# Patient Record
Sex: Female | Born: 1935 | Race: Black or African American | Hispanic: No | Smoking: Never smoker
Health system: Southern US, Community
[De-identification: ages and names within clinical notes are randomized; demographics above are authoritative.]

## PROBLEM LIST (undated history)

## (undated) DIAGNOSIS — Z923 Personal history of irradiation: Secondary | ICD-10-CM

## (undated) DIAGNOSIS — K649 Unspecified hemorrhoids: Secondary | ICD-10-CM

## (undated) DIAGNOSIS — C801 Malignant (primary) neoplasm, unspecified: Secondary | ICD-10-CM

## (undated) DIAGNOSIS — C7931 Secondary malignant neoplasm of brain: Secondary | ICD-10-CM

## (undated) DIAGNOSIS — F419 Anxiety disorder, unspecified: Secondary | ICD-10-CM

## (undated) DIAGNOSIS — K219 Gastro-esophageal reflux disease without esophagitis: Secondary | ICD-10-CM

## (undated) DIAGNOSIS — Z5189 Encounter for other specified aftercare: Secondary | ICD-10-CM

## (undated) DIAGNOSIS — I10 Essential (primary) hypertension: Secondary | ICD-10-CM

## (undated) DIAGNOSIS — E785 Hyperlipidemia, unspecified: Secondary | ICD-10-CM

## (undated) HISTORY — DX: Encounter for other specified aftercare: Z51.89

## (undated) HISTORY — DX: Personal history of irradiation: Z92.3

## (undated) HISTORY — DX: Hyperlipidemia, unspecified: E78.5

## (undated) HISTORY — DX: Unspecified hemorrhoids: K64.9

## (undated) HISTORY — DX: Gastro-esophageal reflux disease without esophagitis: K21.9

---

## 1966-10-05 HISTORY — PX: ABDOMINAL HYSTERECTOMY: SHX81

## 2005-02-24 ENCOUNTER — Emergency Department (HOSPITAL_COMMUNITY): Admission: EM | Admit: 2005-02-24 | Discharge: 2005-02-24 | Payer: Self-pay | Admitting: Family Medicine

## 2006-08-03 ENCOUNTER — Emergency Department (HOSPITAL_COMMUNITY): Admission: EM | Admit: 2006-08-03 | Discharge: 2006-08-03 | Payer: Self-pay | Admitting: Family Medicine

## 2007-02-23 ENCOUNTER — Encounter: Admission: RE | Admit: 2007-02-23 | Discharge: 2007-02-23 | Payer: Self-pay | Admitting: Orthopedic Surgery

## 2007-09-09 ENCOUNTER — Emergency Department (HOSPITAL_COMMUNITY): Admission: EM | Admit: 2007-09-09 | Discharge: 2007-09-09 | Payer: Self-pay | Admitting: Family Medicine

## 2007-12-09 ENCOUNTER — Emergency Department (HOSPITAL_COMMUNITY): Admission: EM | Admit: 2007-12-09 | Discharge: 2007-12-09 | Payer: Self-pay | Admitting: Emergency Medicine

## 2007-12-22 ENCOUNTER — Emergency Department (HOSPITAL_COMMUNITY): Admission: EM | Admit: 2007-12-22 | Discharge: 2007-12-22 | Payer: Self-pay | Admitting: Emergency Medicine

## 2008-01-12 ENCOUNTER — Encounter: Admission: RE | Admit: 2008-01-12 | Discharge: 2008-01-12 | Payer: Self-pay | Admitting: Orthopedic Surgery

## 2008-01-23 ENCOUNTER — Emergency Department (HOSPITAL_COMMUNITY): Admission: EM | Admit: 2008-01-23 | Discharge: 2008-01-23 | Payer: Self-pay | Admitting: Emergency Medicine

## 2008-02-02 ENCOUNTER — Encounter: Admission: RE | Admit: 2008-02-02 | Discharge: 2008-02-20 | Payer: Self-pay | Admitting: Orthopedic Surgery

## 2008-02-21 ENCOUNTER — Encounter: Admission: RE | Admit: 2008-02-21 | Discharge: 2008-02-21 | Payer: Self-pay | Admitting: Orthopedic Surgery

## 2010-02-04 ENCOUNTER — Emergency Department (HOSPITAL_COMMUNITY): Admission: EM | Admit: 2010-02-04 | Discharge: 2010-02-04 | Payer: Self-pay | Admitting: Emergency Medicine

## 2010-06-02 ENCOUNTER — Encounter: Admission: RE | Admit: 2010-06-02 | Discharge: 2010-06-02 | Payer: Self-pay | Admitting: Orthopedic Surgery

## 2010-06-26 ENCOUNTER — Encounter: Admission: RE | Admit: 2010-06-26 | Discharge: 2010-06-26 | Payer: Self-pay | Admitting: Orthopedic Surgery

## 2010-07-06 ENCOUNTER — Emergency Department (HOSPITAL_COMMUNITY): Admission: EM | Admit: 2010-07-06 | Discharge: 2010-07-06 | Payer: Self-pay | Admitting: Emergency Medicine

## 2010-08-28 ENCOUNTER — Ambulatory Visit: Payer: Self-pay | Admitting: Family Medicine

## 2010-08-28 ENCOUNTER — Encounter: Payer: Self-pay | Admitting: Gastroenterology

## 2010-08-28 ENCOUNTER — Inpatient Hospital Stay (HOSPITAL_COMMUNITY): Admission: EM | Admit: 2010-08-28 | Discharge: 2010-08-31 | Payer: Self-pay | Admitting: Emergency Medicine

## 2010-08-29 ENCOUNTER — Ambulatory Visit: Payer: Self-pay | Admitting: Gastroenterology

## 2010-09-03 ENCOUNTER — Encounter: Payer: Self-pay | Admitting: Gastroenterology

## 2010-09-03 DIAGNOSIS — R933 Abnormal findings on diagnostic imaging of other parts of digestive tract: Secondary | ICD-10-CM | POA: Insufficient documentation

## 2010-10-24 ENCOUNTER — Emergency Department (HOSPITAL_COMMUNITY)
Admission: EM | Admit: 2010-10-24 | Discharge: 2010-10-24 | Payer: Self-pay | Source: Home / Self Care | Admitting: Family Medicine

## 2010-10-26 ENCOUNTER — Encounter: Payer: Self-pay | Admitting: Internal Medicine

## 2010-11-06 NOTE — Procedures (Signed)
Summary: Upper Endoscopy  Patient: [name] Note: All result statuses are Final unless otherwise noted.  Tests: (1) Upper Endoscopy (EGD)   EGD Upper Endoscopy       DONE     [facility]     [address]     [place], [place]  [address]           ENDOSCOPY PROCEDURE REPORT           PATIENT:  [name], Katie  MR#:  [id number]     BIRTHDATE:  Feb 03, 1934, 76 yrs. old  GENDER:  female     ENDOSCOPIST:  Rachael Fee, MD     PROCEDURE DATE:  08/29/2010     PROCEDURE:  EGD with biopsy, 43239     ASA CLASS:  Class II     INDICATIONS:  melena, anemia     MEDICATIONS:  Fentanyl 62.5 mcg IV, Versed 6 mg IV     TOPICAL ANESTHETIC:  Cetacaine Spray     DESCRIPTION OF PROCEDURE:   After the risks benefits and     alternatives of the procedure were thoroughly explained, informed     consent was obtained.  The EG-[id number] ([id number]) endoscope was     introduced through the mouth and advanced to the second portion of     the duodenum, without limitations.  The instrument was slowly     withdrawn as the mucosa was fully examined.           <<PROCEDUREIMAGES>>     There was a 3-4cm, round, adenomatous appearing mass located in     proximal stomach, 4cm distal to the GE junction. The mass was     friable and there was one umbilication/ulceration in the mass that     was 3-88mm across. The mass was not actively bleeding. Biopsies     were taken and sent to pathology (jar 1) (see image005, image006,     and image007).  Otherwise the examination was normal (see     image004, image003, image002, and image001).    Retroflexed views     revealed mass.    The scope was then withdrawn from the patient     and the procedure completed.     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  The mass was biopsied.     2) Otherwise normal examination           RECOMMENDATIONS:     Will order staging CT (chest, abd, pelvis), CEA, INR.  I have     contacted general surgery to consult.     Adenocarcinoma vs GIST vs carcinoid.  Given the fact that it is     bleeding, will require resection however if metastatic disease is     found by staging CT then could consider neo-adjuvant chemo/XRT.           ______________________________     Rachael Fee, MD           n.     eSIGNED:   Rachael Fee at 08/29/2010 09:55 AM           Katie [name], [id number]  Note: An exclamation mark (!) indicates a result that was not dispersed into the flowsheet. Document Creation Date: 08/29/2010 9:56 AM _______________________________________________________________________  (1) Order  result status: Final Collection or observation date-time: 08/29/2010 09:42 Requested date-time:  Receipt date-time:  Reported date-time:  Referring Physician:   Ordering Physician: Rob Bunting [phone]) Specimen Source:  Source: Launa Grill Order Number: [id number] Lab site:

## 2010-11-06 NOTE — Letter (Signed)
Summary: Results Letter  [facility]  [address] [place], [place] [address]   Phone: [phone]  Fax: [phone]        September 03, 2010 MRN: [id number]    Katie [name] [address] [place], [place]  [address]    Dear [name],   As we discussed on the phone today, the biopsies of the tumor in your stomach were non-diagnostic.  That tumor certainly is the cause of your bleeding and is likely to bleed again and so I recommend that you visit with the surgeon to consider resection, surgery.  Given the endoscopic and CT scan images of the tumor I doubt that is a gastric adenocarcinoma.  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 I hope you reconsider sitting down with the surgeon to talk about this. My office is going to work with the office of Dr. Lindie Spruce to discuss this further.    Sincerely,  Rachael Fee MD  This letter has been electronically signed by your physician.  Appended Document: Results Letter letter mailed

## 2010-11-06 NOTE — Consult Note (Signed)
Summary: [facility]   Imported By: Sherian Rein 09/04/2010 08:51:22  _____________________________________________________________________  External Attachment:    Type:   Image     Comment:   External Document

## 2010-12-16 LAB — CBC
HCT: 30.9 % — ABNORMAL LOW (ref 36.0–46.0)
Hemoglobin: 10.8 g/dL — ABNORMAL LOW (ref 12.0–15.0)
Hemoglobin: 8 g/dL — ABNORMAL LOW (ref 12.0–15.0)
MCH: 31.6 pg (ref 26.0–34.0)
MCHC: 34.3 g/dL (ref 30.0–36.0)
MCV: 92.1 fL (ref 78.0–100.0)
MCV: 92.5 fL (ref 78.0–100.0)
Platelets: 173 10*3/uL (ref 150–400)
Platelets: 186 10*3/uL (ref 150–400)
Platelets: 198 10*3/uL (ref 150–400)
Platelets: 210 10*3/uL (ref 150–400)
RBC: 2.53 MIL/uL — ABNORMAL LOW (ref 3.87–5.11)
RBC: 3.07 MIL/uL — ABNORMAL LOW (ref 3.87–5.11)
RBC: 3.34 MIL/uL — ABNORMAL LOW (ref 3.87–5.11)
RBC: 3.47 MIL/uL — ABNORMAL LOW (ref 3.87–5.11)
RDW: 13.2 % (ref 11.5–15.5)
WBC: 5.9 10*3/uL (ref 4.0–10.5)
WBC: 6.2 10*3/uL (ref 4.0–10.5)
WBC: 7.9 10*3/uL (ref 4.0–10.5)

## 2010-12-16 LAB — DIFFERENTIAL
Basophils Relative: 0 % (ref 0–1)
Eosinophils Absolute: 0.1 10*3/uL (ref 0.0–0.7)
Monocytes Absolute: 0.4 10*3/uL (ref 0.1–1.0)
Monocytes Relative: 5 % (ref 3–12)
Neutro Abs: 5.8 10*3/uL (ref 1.7–7.7)

## 2010-12-16 LAB — BASIC METABOLIC PANEL
CO2: 27 mEq/L (ref 19–32)
Chloride: 105 mEq/L (ref 96–112)
Chloride: 110 mEq/L (ref 96–112)
Creatinine, Ser: 0.69 mg/dL (ref 0.4–1.2)
GFR calc Af Amer: >60 mL/min (ref >60)
GFR calc Af Amer: >60 mL/min (ref >60)
Glucose, Bld: 140 mg/dL — ABNORMAL HIGH (ref 70–99)
Potassium: 3.5 mEq/L (ref 3.5–5.1)
Sodium: 143 mEq/L (ref 135–145)

## 2010-12-16 LAB — COMPREHENSIVE METABOLIC PANEL
ALT: 17 U/L (ref 0–35)
Albumin: 3.1 g/dL — ABNORMAL LOW (ref 3.5–5.2)
Alkaline Phosphatase: 38 U/L — ABNORMAL LOW (ref 39–117)
Potassium: 3.2 mEq/L — ABNORMAL LOW (ref 3.5–5.1)
Sodium: 136 mEq/L (ref 135–145)
Total Protein: 5.2 g/dL — ABNORMAL LOW (ref 6.0–8.3)

## 2010-12-16 LAB — URINALYSIS, ROUTINE W REFLEX MICROSCOPIC
Bilirubin Urine: NEGATIVE
Glucose, UA: NEGATIVE mg/dL
Ketones, ur: NEGATIVE mg/dL
Nitrite: NEGATIVE
pH: 5.5 (ref 5.0–8.0)

## 2010-12-16 LAB — CROSSMATCH: Unit division: 0

## 2010-12-16 LAB — HEMOCCULT GUIAC POC 1CARD (OFFICE): Fecal Occult Bld: POSITIVE

## 2010-12-16 LAB — CEA: CEA: <0.5 ng/mL (ref 0.0–5.0)

## 2010-12-16 LAB — ABO/RH: ABO/RH(D): A POS

## 2010-12-16 LAB — URINE CULTURE
Colony Count: NO GROWTH
Culture  Setup Time: 201111241805

## 2010-12-16 LAB — HEMOGLOBIN AND HEMATOCRIT, BLOOD: HCT: 25.1 % — ABNORMAL LOW (ref 36.0–46.0)

## 2010-12-16 LAB — PREPARE RBC (CROSSMATCH)

## 2010-12-16 LAB — PROTIME-INR: Prothrombin Time: 12.8 seconds (ref 11.6–15.2)

## 2010-12-18 LAB — URINALYSIS, ROUTINE W REFLEX MICROSCOPIC
Bilirubin Urine: NEGATIVE
Hgb urine dipstick: NEGATIVE
Nitrite: NEGATIVE
Specific Gravity, Urine: 1.021 (ref 1.005–1.030)
pH: 7 (ref 5.0–8.0)

## 2010-12-18 LAB — URINE MICROSCOPIC-ADD ON

## 2010-12-23 LAB — POCT URINALYSIS DIP (DEVICE)
Glucose, UA: NEGATIVE mg/dL
Nitrite: NEGATIVE
Specific Gravity, Urine: 1.025 (ref 1.005–1.030)
Urobilinogen, UA: 1 mg/dL (ref 0.0–1.0)

## 2010-12-23 LAB — URINE CULTURE

## 2011-06-29 LAB — COMPREHENSIVE METABOLIC PANEL
Albumin: 3.7
BUN: 17
Creatinine, Ser: 0.77
Total Protein: 6.5

## 2011-06-29 LAB — POCT URINALYSIS DIP (DEVICE)
Hgb urine dipstick: NEGATIVE
Protein, ur: NEGATIVE
Specific Gravity, Urine: 1.02
pH: 5.5

## 2011-06-29 LAB — DIFFERENTIAL
Basophils Absolute: 0
Lymphocytes Relative: 15
Monocytes Absolute: 0.5
Monocytes Relative: 5
Neutro Abs: 8.1 — ABNORMAL HIGH
Neutrophils Relative %: 79 — ABNORMAL HIGH

## 2011-06-29 LAB — CBC
HCT: 38.9
MCHC: 34.5
MCV: 90.6
Platelets: 200
RDW: 13.3

## 2012-01-02 ENCOUNTER — Emergency Department (INDEPENDENT_AMBULATORY_CARE_PROVIDER_SITE_OTHER)
Admission: EM | Admit: 2012-01-02 | Discharge: 2012-01-02 | Disposition: A | Discharge status: Home / Self Care | Payer: Medicare Other | Source: Home / Self Care | Attending: Emergency Medicine | Admitting: Emergency Medicine

## 2012-01-02 ENCOUNTER — Encounter (HOSPITAL_COMMUNITY): Payer: Self-pay | Admitting: *Deleted

## 2012-01-02 DIAGNOSIS — L509 Urticaria, unspecified: Secondary | ICD-10-CM

## 2012-01-02 HISTORY — DX: Anxiety disorder, unspecified: F41.9

## 2012-01-02 HISTORY — DX: Essential (primary) hypertension: I10

## 2012-01-02 MED ORDER — PREDNISONE 20 MG PO TABS: 40.0000 mg | ORAL_TABLET | Freq: Every day | ORAL | Status: AC

## 2012-01-02 MED ORDER — HYDROXYZINE HCL 25 MG PO TABS: 25.0000 mg | ORAL_TABLET | Freq: Four times a day (QID) | ORAL | Status: AC

## 2012-01-02 NOTE — ED Provider Notes (Signed)
History     CSN: [id number]  Arrival date & time 01/02/12  4098   First MD Initiated Contact with Patient 01/02/12 1013      Chief Complaint  Patient presents with  . Rash    (Consider location/radiation/quality/duration/timing/severity/associated sxs/prior treatment) HPI Comments: MH that much but they come and go have some new ones, left forearm today that didn't have yesterday some on my torso some on the legs, arms. No one else at home or in [place] stain for about a week had a similar rash. Suspicious of a lotion and been using her my skin lately he'll started after he started using this new lotion. No discomfort swallowing, no swelling, shortness of breath or troubles breathing  Patient is a 78 y.o. female presenting with rash. The history is provided by the patient.  Rash  This is a new problem. The current episode started more than 2 days ago. The problem has not changed since onset.The problem is associated with nothing. There has been no fever. The patient is experiencing no pain. The pain has been intermittent since onset. Associated symptoms include itching. Pertinent negatives include no blisters and no weeping.    Past Medical History  Diagnosis Date  . Glaucoma   . Hypertension   . Anxiety     Past Surgical History  Procedure Date  . Abdominal hysterectomy     History reviewed. No pertinent family history.  History  Substance Use Topics  . Smoking status: Never Smoker   . Smokeless tobacco: Not on file  . Alcohol Use: No    OB History    Grav Para Term Preterm Abortions TAB SAB Ect Mult Living                  Review of Systems  Constitutional: Negative for fever, chills, appetite change and fatigue.  HENT: Negative for congestion, facial swelling, rhinorrhea, neck pain and ear discharge.   Respiratory: Negative for cough, shortness of breath and wheezing.   Cardiovascular: Negative for leg swelling.  Skin: Positive for itching and rash.     Allergies  Review of patient's allergies indicates no known allergies.  Home Medications   Current Outpatient Rx  Name Route Sig Dispense Refill  . ALPRAZOLAM 0.25 MG PO TABS Oral Take 0.25 mg by mouth at bedtime as needed.    Marland Kitchen BENAZEPRIL-HYDROCHLOROTHIAZIDE 20-25 MG PO TABS Oral Take 1 tablet by mouth daily.    Marland Kitchen LATANOPROST 0.005 % OP SOLN  1 drop at bedtime.    Marland Kitchen VERAPAMIL HCL 120 MG PO TABS Oral Take 120 mg by mouth 2 (two) times daily.    Marland Kitchen HYDROXYZINE HCL 25 MG PO TABS Oral Take 1 tablet (25 mg total) by mouth every 6 (six) hours. 12 tablet 0  . PREDNISONE 20 MG PO TABS Oral Take 2 tablets (40 mg total) by mouth daily. 2 tablets daily for 5 days 10 tablet 0    BP 130/80  Pulse 75  Temp(Src) 98 F (36.7 C) (Oral)  Resp 16  SpO2 98%  Physical Exam  Constitutional: She appears well-developed and well-nourished.  Non-toxic appearance. She does not have a sickly appearance. She does not appear ill. No distress.  HENT:  Head: Normocephalic.  Mouth/Throat: Uvula is midline and oropharynx is clear and moist. No oropharyngeal exudate, posterior oropharyngeal edema or posterior oropharyngeal erythema.  Eyes: Conjunctivae are normal.  Neck: Neck supple.  Pulmonary/Chest: Effort normal and breath sounds normal. No respiratory distress.  Abdominal: There  is no tenderness. There is no rebound.  Musculoskeletal: She exhibits no tenderness.  Neurological: She is alert.  Skin: Skin is warm. Rash noted. She is not diaphoretic. There is erythema.    ED Course  Procedures (including critical care time)  Labs Reviewed - No data to display No results found.   1. Urticaria       MDM  Global generalized papular abruption erythema character that clears easily with digital pressure no mucosal movement and no respiratory symptoms. Symptomatic treatment for hives urticaria explained        Jimmie Molly, MD 01/02/12 1147

## 2012-01-02 NOTE — Discharge Instructions (Signed)
Has discuss use this to medicines prednisone for 5 days and the hydroxyzine as needed every 6-8 hours (only if needed)

## 2012-01-02 NOTE — ED Notes (Addendum)
Pt with onset of rash Thursday - will improve then reappear - denies itching - rash on arms and one spot on right side abd - pt using new hand body lotion has been using on arms x one month denies new medications verapamil blue tab instead of white x 2 weeks

## 2012-06-13 ENCOUNTER — Ambulatory Visit (HOSPITAL_COMMUNITY): Payer: Medicare Other

## 2012-06-14 ENCOUNTER — Other Ambulatory Visit (HOSPITAL_COMMUNITY): Payer: Medicare Other

## 2012-06-16 ENCOUNTER — Other Ambulatory Visit (HOSPITAL_COMMUNITY): Payer: Self-pay | Admitting: Internal Medicine

## 2012-06-16 ENCOUNTER — Ambulatory Visit (HOSPITAL_COMMUNITY): Payer: Medicare Other | Attending: Internal Medicine | Admitting: Radiology

## 2012-06-16 DIAGNOSIS — I369 Nonrheumatic tricuspid valve disorder, unspecified: Secondary | ICD-10-CM | POA: Insufficient documentation

## 2012-06-16 DIAGNOSIS — I10 Essential (primary) hypertension: Secondary | ICD-10-CM | POA: Insufficient documentation

## 2012-06-16 DIAGNOSIS — I059 Rheumatic mitral valve disease, unspecified: Secondary | ICD-10-CM | POA: Insufficient documentation

## 2012-06-16 DIAGNOSIS — R0602 Shortness of breath: Secondary | ICD-10-CM

## 2012-06-16 DIAGNOSIS — R0989 Other specified symptoms and signs involving the circulatory and respiratory systems: Secondary | ICD-10-CM | POA: Insufficient documentation

## 2012-06-16 DIAGNOSIS — R0609 Other forms of dyspnea: Secondary | ICD-10-CM | POA: Insufficient documentation

## 2012-06-16 NOTE — Progress Notes (Signed)
Echocardiogram performed.  

## 2012-06-17 ENCOUNTER — Encounter (HOSPITAL_COMMUNITY): Payer: Self-pay | Admitting: Internal Medicine

## 2012-11-29 ENCOUNTER — Encounter: Payer: Self-pay | Admitting: Gastroenterology

## 2012-12-09 ENCOUNTER — Ambulatory Visit: Payer: Medicare Other | Admitting: Gastroenterology

## 2012-12-14 ENCOUNTER — Ambulatory Visit: Payer: Medicare Other | Admitting: Gastroenterology

## 2012-12-16 ENCOUNTER — Encounter: Payer: Self-pay | Admitting: Gastroenterology

## 2013-01-03 ENCOUNTER — Ambulatory Visit: Payer: Medicare Other | Admitting: Gastroenterology

## 2013-01-08 ENCOUNTER — Encounter (HOSPITAL_BASED_OUTPATIENT_CLINIC_OR_DEPARTMENT_OTHER): Payer: Self-pay

## 2013-01-08 ENCOUNTER — Emergency Department (HOSPITAL_BASED_OUTPATIENT_CLINIC_OR_DEPARTMENT_OTHER)
Admission: EM | Admit: 2013-01-08 | Discharge: 2013-01-08 | Disposition: A | Discharge status: Home / Self Care | Payer: Medicare Other | Source: Home / Self Care | Attending: Emergency Medicine | Admitting: Emergency Medicine

## 2013-01-08 DIAGNOSIS — K625 Hemorrhage of anus and rectum: Secondary | ICD-10-CM

## 2013-01-08 LAB — CBC WITH DIFFERENTIAL/PLATELET
Basophils Absolute: 0 10*3/uL (ref 0.0–0.1)
Eosinophils Relative: 1 % (ref 0–5)
HCT: 34 % — ABNORMAL LOW (ref 36.0–46.0)
Hemoglobin: 11.5 g/dL — ABNORMAL LOW (ref 12.0–15.0)
Lymphocytes Relative: 13 % (ref 12–46)
Lymphs Abs: 1.3 10*3/uL (ref 0.7–4.0)
MCV: 85.4 fL (ref 78.0–100.0)
Monocytes Absolute: 0.5 10*3/uL (ref 0.1–1.0)
Monocytes Relative: 5 % (ref 3–12)
Neutro Abs: 7.7 10*3/uL (ref 1.7–7.7)
RBC: 3.98 MIL/uL (ref 3.87–5.11)
RDW: 14.2 % (ref 11.5–15.5)
WBC: 9.6 10*3/uL (ref 4.0–10.5)

## 2013-01-08 LAB — COMPREHENSIVE METABOLIC PANEL
ALT: 12 U/L (ref 0–35)
AST: 20 U/L (ref 0–37)
CO2: 25 mEq/L (ref 19–32)
Calcium: 9.2 mg/dL (ref 8.4–10.5)
Chloride: 96 mEq/L (ref 96–112)
Creatinine, Ser: 0.6 mg/dL (ref 0.50–1.10)
GFR calc Af Amer: >90 mL/min (ref >90)
GFR calc non Af Amer: 86 mL/min — ABNORMAL LOW (ref >90)
Glucose, Bld: 138 mg/dL — ABNORMAL HIGH (ref 70–99)
Total Bilirubin: 0.4 mg/dL (ref 0.3–1.2)

## 2013-01-08 NOTE — ED Provider Notes (Signed)
History     CSN: [id number]  Arrival date & time 01/08/13  1039   First MD Initiated Contact with Patient 01/08/13 1101      Chief Complaint  Patient presents with  . Rectal Bleeding    (Consider location/radiation/quality/duration/timing/severity/associated sxs/prior treatment) Patient is a 79 y.o. female presenting with hematochezia. The history is provided by the patient.  Rectal Bleeding  The current episode started today. Pertinent negatives include no fever, no abdominal pain, no diarrhea, no nausea, no vomiting, no hematuria, no chest pain, no headaches and no rash.   patient with black tarry stool bowel movement x1 this morning. After seeing that she got nervous and got dizzy and lightheaded. No bowel pain. No nausea no vomiting no diarrhea. Patient now feels fine. No further bowel movements. Patient in 2011 have to apply tarry stools that eventually led to blood transfusion. Also had upper endoscopy done at that time there was a non-cancerous lesion in the stomach that they thought maybe was the culprit however wasn't actively bleeding when they looked at it. Patient hasn't had any problems since then. She has a new primary care Dr. with an appointment tomorrow. That is Dr. Clide Deutscher.  Past Medical History  Diagnosis Date  . Glaucoma(365)   . Hypertension   . Anxiety   . GERD (gastroesophageal reflux disease)   . Hemorrhoids   . Hyperlipidemia     Past Surgical History  Procedure Laterality Date  . Abdominal hysterectomy      Family History  Problem Relation Age of Onset  . Cancer Sister   . Hyperlipidemia Sister   . Hypertension Sister     History  Substance Use Topics  . Smoking status: Never Smoker   . Smokeless tobacco: Never Used  . Alcohol Use: No    OB History   Grav Para Term Preterm Abortions TAB SAB Ect Mult Living                  Review of Systems  Constitutional: Negative for fever and fatigue.  HENT: Negative for congestion.   Eyes: Negative  for redness.  Respiratory: Negative for shortness of breath.   Cardiovascular: Negative for chest pain.  Gastrointestinal: Positive for blood in stool and hematochezia. Negative for nausea, vomiting, abdominal pain and diarrhea.  Genitourinary: Negative for dysuria and hematuria.  Musculoskeletal: Negative for back pain.  Skin: Negative for rash.  Neurological: Positive for light-headedness. Negative for syncope and headaches.  Hematological: Does not bruise/bleed easily.  Psychiatric/Behavioral: Negative for confusion.    Allergies  Review of patient's allergies indicates no known allergies.  Home Medications   Current Outpatient Rx  Name  Route  Sig  Dispense  Refill  . ALPRAZolam (XANAX) 0.25 MG tablet   Oral   Take 0.25 mg by mouth at bedtime as needed.         . ALPRAZolam (XANAX) 0.5 MG tablet   Oral   Take 0.5 mg by mouth at bedtime as needed for sleep.         . benazepril-hydrochlorthiazide (LOTENSIN HCT) 20-25 MG per tablet   Oral   Take 1 tablet by mouth 2 (two) times daily.          Marland Kitchen latanoprost (XALATAN) 0.005 % ophthalmic solution      1 drop at bedtime.         . verapamil (CALAN-SR) 240 MG CR tablet   Oral   Take 240 mg by mouth at bedtime.         Marland Kitchen  verapamil (CALAN) 120 MG tablet   Oral   Take 120 mg by mouth 2 (two) times daily.           BP 143/75  Pulse 84  Temp(Src) 97.4 F (36.3 C) (Oral)  Resp 20  Ht 5\' 2"  (1.575 m)  Wt 170 lb (77.111 kg)  BMI 31.09 kg/m2  SpO2 97%  Physical Exam  Nursing note and vitals reviewed. Constitutional: She is oriented to person, place, and time. She appears well-developed and well-nourished.  HENT:  Head: Normocephalic and atraumatic.  Eyes: Conjunctivae and EOM are normal. Pupils are equal, round, and reactive to light.  Neck: Normal range of motion. Neck supple.  Cardiovascular: Normal rate, regular rhythm and normal heart sounds.   No murmur heard. Pulmonary/Chest: Effort normal and  breath sounds normal.  Abdominal: Soft. Bowel sounds are normal. There is no tenderness.  Musculoskeletal: Normal range of motion. She exhibits no edema.  Neurological: She is alert and oriented to person, place, and time. No cranial nerve deficit. She exhibits normal muscle tone. Coordination normal.  Skin: Skin is warm. No rash noted.    ED Course  Procedures (including critical care time)  Labs Reviewed  CBC WITH DIFFERENTIAL - Abnormal; Notable for the following:    Hemoglobin 11.5 (*)    HCT 34.0 (*)    Neutrophils Relative 80 (*)    All other components within normal limits  COMPREHENSIVE METABOLIC PANEL - Abnormal; Notable for the following:    Sodium 133 (*)    Potassium 3.4 (*)    Glucose, Bld 138 (*)    BUN 29 (*)    GFR calc non Af Amer 86 (*)    All other components within normal limits  LIPASE, BLOOD   No results found. Results for orders placed during the hospital encounter of 01/08/13  CBC WITH DIFFERENTIAL      Result Value Range   WBC 9.6  4.0 - 10.5 K/uL   RBC 3.98  3.87 - 5.11 MIL/uL   Hemoglobin 11.5 (*) 12.0 - 15.0 g/dL   HCT 16.1 (*) 09.6 - 04.5 %   MCV 85.4  78.0 - 100.0 fL   MCH 28.9  26.0 - 34.0 pg   MCHC 33.8  30.0 - 36.0 g/dL   RDW 40.9  81.1 - 91.4 %   Platelets 231  150 - 400 K/uL   Neutrophils Relative 80 (*) 43 - 77 %   Neutro Abs 7.7  1.7 - 7.7 K/uL   Lymphocytes Relative 13  12 - 46 %   Lymphs Abs 1.3  0.7 - 4.0 K/uL   Monocytes Relative 5  3 - 12 %   Monocytes Absolute 0.5  0.1 - 1.0 K/uL   Eosinophils Relative 1  0 - 5 %   Eosinophils Absolute 0.1  0.0 - 0.7 K/uL   Basophils Relative 0  0 - 1 %   Basophils Absolute 0.0  0.0 - 0.1 K/uL  COMPREHENSIVE METABOLIC PANEL      Result Value Range   Sodium 133 (*) 135 - 145 mEq/L   Potassium 3.4 (*) 3.5 - 5.1 mEq/L   Chloride 96  96 - 112 mEq/L   CO2 25  19 - 32 mEq/L   Glucose, Bld 138 (*) 70 - 99 mg/dL   BUN 29 (*) 6 - 23 mg/dL   Creatinine, Ser 7.82  0.50 - 1.10 mg/dL   Calcium 9.2   8.4 - 95.6 mg/dL   Total Protein 7.0  6.0 - 8.3 g/dL   Albumin 3.8  3.5 - 5.2 g/dL   AST 20  0 - 37 U/L   ALT 12  0 - 35 U/L   Alkaline Phosphatase 55  39 - 117 U/L   Total Bilirubin 0.4  0.3 - 1.2 mg/dL   GFR calc non Af Amer 86 (*) >90 mL/min   GFR calc Af Amer >90  >90 mL/min  LIPASE, BLOOD      Result Value Range   Lipase 32  11 - 59 U/L     1. Rectal bleeding       MDM  Patient noted black stools this morning. After noticing that the felt lightheaded but has been asymptomatic since that time. Workup here without any significant blood loss. Vital signs are normal not hypotensive not tachycardic. Hemoglobin was 11.5 and hematocrit 34.0 is normal platelet count is also normal. No abdominal pain. Patient with a history and 2011 of presumed upper GI bleed with black tarry stools that eventually lead to a transfusion. Patient has followup with her primary care doctor tomorrow. She will barely keep that appointment. Return for any newer worse symptoms. This point in time based on her labs and her vital signs in her symptoms she can be discharged home with close followup. May end up needing a repeat upper endoscopy.        Shelda Jakes, MD 01/08/13 [phone]

## 2013-01-08 NOTE — ED Notes (Signed)
Pt states that she felt she needed to have a BM and noted black tar like stool when she had the BM.  Pt states that she then became dizzy after having this BM and became nervous.  Pt states that she has hx of a tumor in her stomach from a previous endoscopy.

## 2013-01-09 ENCOUNTER — Emergency Department (HOSPITAL_BASED_OUTPATIENT_CLINIC_OR_DEPARTMENT_OTHER): Payer: Medicare Other

## 2013-01-09 ENCOUNTER — Encounter (HOSPITAL_BASED_OUTPATIENT_CLINIC_OR_DEPARTMENT_OTHER): Payer: Self-pay | Admitting: Emergency Medicine

## 2013-01-09 ENCOUNTER — Inpatient Hospital Stay (HOSPITAL_BASED_OUTPATIENT_CLINIC_OR_DEPARTMENT_OTHER)
Admission: EM | Admit: 2013-01-09 | Discharge: 2013-01-12 | DRG: 565 | Disposition: A | Discharge status: Home / Self Care | Payer: Medicare Other | Attending: Internal Medicine | Admitting: Internal Medicine

## 2013-01-09 DIAGNOSIS — D62 Acute posthemorrhagic anemia: Secondary | ICD-10-CM | POA: Diagnosis present

## 2013-01-09 DIAGNOSIS — C20 Malignant neoplasm of rectum: Secondary | ICD-10-CM

## 2013-01-09 DIAGNOSIS — E876 Hypokalemia: Secondary | ICD-10-CM | POA: Diagnosis present

## 2013-01-09 DIAGNOSIS — R911 Solitary pulmonary nodule: Secondary | ICD-10-CM

## 2013-01-09 DIAGNOSIS — R9389 Abnormal findings on diagnostic imaging of other specified body structures: Secondary | ICD-10-CM

## 2013-01-09 DIAGNOSIS — D649 Anemia, unspecified: Secondary | ICD-10-CM

## 2013-01-09 DIAGNOSIS — D126 Benign neoplasm of colon, unspecified: Secondary | ICD-10-CM | POA: Diagnosis present

## 2013-01-09 DIAGNOSIS — Z79899 Other long term (current) drug therapy: Secondary | ICD-10-CM

## 2013-01-09 DIAGNOSIS — F411 Generalized anxiety disorder: Secondary | ICD-10-CM | POA: Diagnosis present

## 2013-01-09 DIAGNOSIS — C787 Secondary malignant neoplasm of liver and intrahepatic bile duct: Secondary | ICD-10-CM | POA: Diagnosis present

## 2013-01-09 DIAGNOSIS — K5730 Diverticulosis of large intestine without perforation or abscess without bleeding: Secondary | ICD-10-CM | POA: Diagnosis present

## 2013-01-09 DIAGNOSIS — K921 Melena: Secondary | ICD-10-CM

## 2013-01-09 DIAGNOSIS — K922 Gastrointestinal hemorrhage, unspecified: Secondary | ICD-10-CM

## 2013-01-09 DIAGNOSIS — D72819 Decreased white blood cell count, unspecified: Secondary | ICD-10-CM | POA: Diagnosis present

## 2013-01-09 DIAGNOSIS — H409 Unspecified glaucoma: Secondary | ICD-10-CM | POA: Diagnosis present

## 2013-01-09 DIAGNOSIS — D481 Neoplasm of uncertain behavior of connective and other soft tissue: Principal | ICD-10-CM | POA: Diagnosis present

## 2013-01-09 DIAGNOSIS — R933 Abnormal findings on diagnostic imaging of other parts of digestive tract: Secondary | ICD-10-CM

## 2013-01-09 DIAGNOSIS — R599 Enlarged lymph nodes, unspecified: Secondary | ICD-10-CM | POA: Diagnosis present

## 2013-01-09 DIAGNOSIS — D4819 Other specified neoplasm of uncertain behavior of connective and other soft tissue: Principal | ICD-10-CM | POA: Diagnosis present

## 2013-01-09 DIAGNOSIS — C7800 Secondary malignant neoplasm of unspecified lung: Secondary | ICD-10-CM | POA: Diagnosis present

## 2013-01-09 DIAGNOSIS — K219 Gastro-esophageal reflux disease without esophagitis: Secondary | ICD-10-CM | POA: Diagnosis present

## 2013-01-09 DIAGNOSIS — M5430 Sciatica, unspecified side: Secondary | ICD-10-CM | POA: Diagnosis present

## 2013-01-09 DIAGNOSIS — I10 Essential (primary) hypertension: Secondary | ICD-10-CM | POA: Diagnosis present

## 2013-01-09 DIAGNOSIS — R918 Other nonspecific abnormal finding of lung field: Secondary | ICD-10-CM

## 2013-01-09 DIAGNOSIS — E785 Hyperlipidemia, unspecified: Secondary | ICD-10-CM | POA: Diagnosis present

## 2013-01-09 LAB — MRSA PCR SCREENING: MRSA by PCR: NEGATIVE

## 2013-01-09 LAB — COMPREHENSIVE METABOLIC PANEL
BUN: 42 mg/dL — ABNORMAL HIGH (ref 6–23)
Calcium: 8.9 mg/dL (ref 8.4–10.5)
Creatinine, Ser: 0.7 mg/dL (ref 0.50–1.10)
GFR calc Af Amer: >90 mL/min (ref >90)
Glucose, Bld: 253 mg/dL — ABNORMAL HIGH (ref 70–99)
Total Protein: 6.1 g/dL (ref 6.0–8.3)

## 2013-01-09 LAB — URINE MICROSCOPIC-ADD ON

## 2013-01-09 LAB — CBC WITH DIFFERENTIAL/PLATELET
Basophils Absolute: 0 10*3/uL (ref 0.0–0.1)
Eosinophils Absolute: 0 10*3/uL (ref 0.0–0.7)
Eosinophils Relative: 0 % (ref 0–5)
MCH: 28.5 pg (ref 26.0–34.0)
MCV: 86.9 fL (ref 78.0–100.0)
Neutrophils Relative %: 71 % (ref 43–77)
Platelets: 251 10*3/uL (ref 150–400)
RDW: 13.9 % (ref 11.5–15.5)
WBC: 11.6 10*3/uL — ABNORMAL HIGH (ref 4.0–10.5)

## 2013-01-09 LAB — URINALYSIS, ROUTINE W REFLEX MICROSCOPIC
Bilirubin Urine: NEGATIVE
Specific Gravity, Urine: 1.013 (ref 1.005–1.030)
pH: 6.5 (ref 5.0–8.0)

## 2013-01-09 LAB — ABO/RH: ABO/RH(D): A POS

## 2013-01-09 LAB — PROTIME-INR
INR: 1.05 (ref 0.00–1.49)
Prothrombin Time: 13.6 seconds (ref 11.6–15.2)

## 2013-01-09 LAB — RETICULOCYTES: RBC.: 2.58 MIL/uL — ABNORMAL LOW (ref 3.87–5.11)

## 2013-01-09 LAB — LIPASE, BLOOD: Lipase: 22 U/L (ref 11–59)

## 2013-01-09 LAB — OCCULT BLOOD X 1 CARD TO LAB, STOOL: Fecal Occult Bld: POSITIVE — AB

## 2013-01-09 MED ORDER — LATANOPROST 0.005 % OP SOLN
1.0000 [drp] | Freq: Every day | OPHTHALMIC | Status: DC
  Administered 2013-01-09 – 2013-01-11 (×3): 1 [drp] via OPHTHALMIC
  Filled 2013-01-09: qty 2.5

## 2013-01-09 MED ORDER — PROMETHAZINE HCL 25 MG PO TABS: 12.5000 mg | ORAL_TABLET | Freq: Four times a day (QID) | ORAL | Status: DC | PRN

## 2013-01-09 MED ORDER — SODIUM CHLORIDE 0.9 % IV BOLUS (SEPSIS)
1000.0000 mL | Freq: Once | INTRAVENOUS | Status: AC
  Administered 2013-01-09: 1000 mL via INTRAVENOUS

## 2013-01-09 MED ORDER — SODIUM CHLORIDE 0.9 % IV SOLN: INTRAVENOUS | Status: AC

## 2013-01-09 MED ORDER — ACETAMINOPHEN 325 MG PO TABS: 650.0000 mg | ORAL_TABLET | Freq: Four times a day (QID) | ORAL | Status: DC | PRN

## 2013-01-09 MED ORDER — ALPRAZOLAM 0.5 MG PO TABS
0.5000 mg | ORAL_TABLET | Freq: Every evening | ORAL | Status: DC | PRN
  Administered 2013-01-09 – 2013-01-11 (×3): 0.5 mg via ORAL
  Filled 2013-01-09 (×3): qty 1

## 2013-01-09 MED ORDER — ACETAMINOPHEN 650 MG RE SUPP: 650.0000 mg | Freq: Four times a day (QID) | RECTAL | Status: DC | PRN

## 2013-01-09 MED ORDER — PANTOPRAZOLE SODIUM 40 MG IV SOLR: 40.0000 mg | INTRAVENOUS | Status: DC

## 2013-01-09 MED ORDER — SODIUM CHLORIDE 0.9 % IV SOLN
80.0000 mg | Freq: Once | INTRAVENOUS | Status: AC
  Administered 2013-01-09: 80 mg via INTRAVENOUS
  Filled 2013-01-09: qty 80

## 2013-01-09 MED ORDER — OXYCODONE HCL 5 MG PO TABS: 5.0000 mg | ORAL_TABLET | ORAL | Status: DC | PRN

## 2013-01-09 MED ORDER — SODIUM CHLORIDE 0.9 % IV BOLUS (SEPSIS)
2000.0000 mL | Freq: Once | INTRAVENOUS | Status: AC
  Administered 2013-01-09: 1000 mL via INTRAVENOUS

## 2013-01-09 MED ORDER — PANTOPRAZOLE SODIUM 40 MG IV SOLR
40.0000 mg | Freq: Every day | INTRAVENOUS | Status: DC
  Administered 2013-01-10 – 2013-01-11 (×2): 40 mg via INTRAVENOUS
  Filled 2013-01-09 (×3): qty 40

## 2013-01-09 MED ORDER — DEXTROSE-NACL 5-0.9 % IV SOLN
INTRAVENOUS | Status: DC
  Administered 2013-01-09 – 2013-01-11 (×3): via INTRAVENOUS

## 2013-01-09 MED ORDER — SODIUM CHLORIDE 0.9 % IJ SOLN
3.0000 mL | Freq: Two times a day (BID) | INTRAMUSCULAR | Status: DC
  Administered 2013-01-09: 10 mL via INTRAVENOUS
  Administered 2013-01-10 – 2013-01-11 (×4): 3 mL via INTRAVENOUS

## 2013-01-09 MED ORDER — SODIUM CHLORIDE 0.9 % IV SOLN
INTRAVENOUS | Status: DC
  Administered 2013-01-09: 08:00:00 via INTRAVENOUS

## 2013-01-09 NOTE — ED Notes (Signed)
Patient states she was seen here yesterday for black tarry stools.  This morning at 0530 she had crampy abdominal pain and one large bloody stool.  Called a cab for transport to ed.  Upon entering room via w/c, needed to have a bm, and was transported to the bathroom, she had a large bloody stool, became cool and clampy.  Pt was transported to room 14.

## 2013-01-09 NOTE — Progress Notes (Signed)
Orthostatic BP: Lying: 129/44 Sitting: 111/57 Standing: 81/54

## 2013-01-09 NOTE — H&P (Signed)
[facility] History and Physical  Katie [name] ZOX:[id number] DOB: 1933-12-14 DOA: 01/09/2013   PCP: Avon Gully, MD  Specialists: Dr Christella Hartigan  Chief Complaint: melanotic stools since 2 days.   HPI: Katie [name] is a 79 y.o. female with prior h/o mass in her stomach , hypertension, GERD, hyperlipidemia, came in to ED yesterday for melanotic stools , she underwent basic lab work and was discharged. She presented today at [facility] with recurrent melanotic stools and dizziness and was found to have a drop of 3 gm of hemoglobin. She was referred to [facility] for admission tomedical service and GI for consultation. On arrival she denies any abd pain, slight nausea . She denies hematemesis. She denies NSAID use and hematochezia.    Review of Systems: The patient denies anorexia, fever, weight loss,, vision loss, decreased hearing, hoarseness, chest pain, dyspnea on exertion, peripheral edema, balance deficits, hemoptysis, abdominal pain, melena, hematochezia, severe indigestion/heartburn, hematuria, incontinence, genital sores, muscle weakness, suspicious skin lesions, transient blindness, difficulty walking, depression, unusual weight change, abnormal bleeding, enlarged lymph nodes, angioedema, and breast masses.    Past Medical History  Diagnosis Date  . Glaucoma(365)   . Hypertension   . Anxiety   . GERD (gastroesophageal reflux disease)   . Hemorrhoids   . Hyperlipidemia    Past Surgical History  Procedure Laterality Date  . Abdominal hysterectomy     Social History:  reports that she has never smoked. She has never used smokeless tobacco. She reports that she does not drink alcohol or use illicit drugs. where does patient live--home, No Known Allergies  Family History  Problem Relation Age of Onset  . Cancer Sister   . Hyperlipidemia Sister   . Hypertension Sister     Prior to Admission medications   Medication Sig Start Date End Date Taking? Authorizing Provider  ALPRAZolam  Prudy Feeler) 0.5 MG tablet Take 0.5 mg by mouth at bedtime as needed for sleep.   Yes Historical Provider, MD  benazepril-hydrochlorthiazide (LOTENSIN HCT) 20-25 MG per tablet Take 1 tablet by mouth 2 (two) times daily.    Yes Historical Provider, MD  latanoprost (XALATAN) 0.005 % ophthalmic solution 1 drop at bedtime.   Yes Historical Provider, MD  verapamil (CALAN-SR) 120 MG CR tablet Take 120 mg by mouth 2 (two) times daily.   Yes Historical Provider, MD   Physical Exam: Filed Vitals:   01/09/13 1051 01/09/13 1052 01/09/13 1053 01/09/13 1200  BP: 129/44 111/57 81/54 116/42  Pulse: 89   86  Temp:    98.6 F (37 C)  TempSrc:    Oral  Resp: 13   14  Height:      Weight:      SpO2: 99%   100%    Constitutional: Vital signs reviewed.  Patient is a well-developed and well-nourished  in no acute distress and cooperative with exam. Alert and oriented x3.  Head: Normocephalic and atraumatic Mouth: no erythema or exudates, MMM Eyes: PERRL, EOMI, conjunctivae pale, No scleral icterus.  Neck: Supple, Trachea midline normal ROM, No JVD, mass, thyromegaly, or carotid bruit present.  Cardiovascular: RRR, S1 normal, S2 normal, no MRG, pulses symmetric and intact bilaterally Pulmonary/Chest: CTAB, no wheezes, rales, or rhonchi Abdominal: Soft. Non-tender, non-distended, bowel sounds are normal, no masses, organomegaly, or guarding present.  Musculoskeletal: No joint deformities, erythema, or stiffness, ROM full and no nontender Hematology: no cervical, inginal, or axillary adenopathy.  Neurological: A&O x3, Strength is normal and symmetric bilaterally, cranial nerve II-XII are grossly intact,  no focal motor deficit, sensory intact to light touch bilaterally.  Skin: Warm, dry and intact. No rash, cyanosis, or clubbing.  Psychiatric: Normal mood and affect. speech and behavior is normal.  Labs on Admission:  Basic Metabolic Panel:  Recent Labs Lab 01/08/13 1200 01/09/13 0640  NA 133* 135  K 3.4*  3.3*  CL 96 97  CO2 25 24  GLUCOSE 138* 253*  BUN 29* 42*  CREATININE 0.60 0.70  CALCIUM 9.2 8.9   Liver Function Tests:  Recent Labs Lab 01/08/13 1200 01/09/13 0640  AST 20 18  ALT 12 12  ALKPHOS 55 45  BILITOT 0.4 0.3  PROT 7.0 6.1  ALBUMIN 3.8 3.4*    Recent Labs Lab 01/08/13 1200 01/09/13 0640  LIPASE 32 22   No results found for this basename: AMMONIA,  in the last 168 hours CBC:  Recent Labs Lab 01/08/13 1200 01/09/13 0340 01/09/13 1104  WBC 9.6 11.6*  --   NEUTROABS 7.7 8.2*  --   HGB 11.5* 8.7* 7.5*  HCT 34.0* 26.5* 22.0*  MCV 85.4 86.9  --   PLT 231 251  --    Cardiac Enzymes:  Recent Labs Lab 01/09/13 0640  TROPONINI <0.30    BNP (last 3 results) No results found for this basename: PROBNP,  in the last 8760 hours CBG: No results found for this basename: GLUCAP,  in the last 168 hours  Radiological Exams on Admission: Dg Chest Port 1 View  01/09/2013  *RADIOLOGY REPORT*  Clinical Data: Abdominal pain with gaseous distension.  History of hypertension and gastroesophageal reflux.  PORTABLE CHEST - 1 VIEW  Comparison: 02/21/2008 radiographs; CT 08/29/2010.  Findings: 0700 hours.  The heart size and mediastinal contours are stable.  There is a small nodular density superimposed over the posterior aspect of the right ninth rib.  There is no corresponding finding on the prior studies.  The lungs are otherwise clear and there is no pleural effusion or pneumothorax.  Telemetry leads overlie the chest.  IMPRESSION:  1.  No acute cardiopulmonary process identified. 2.  Cannot exclude right basilar pulmonary nodule.  This could be due to an overlap of vascular and osseous structures. Unless CT is indicated for other reasons, I would suggest initial assessment with a repeat PA view.   Original Report Authenticated By: Carey Bullocks, M.D.     EKG:   Assessment/Plan Active Problems:   Black tarry stools   GI bleed   Anemia   Leukocytopenia, unspecified    Hypokalemia   Solitary pulmonary nodule   1. Melanotic stools: probably upper GI bleed from the stomach mass diag 3 years ago.  - admit to step down - her h&H further dropped to 7.8. - order 2 untis of prbc transfusion - NPO - IV fluids - iv protonix - scd's -H&H Q8hrs - GI ON board.      Code Status: full code Family Communication: noen atbedside Disposition Plan: pending.    [facility] Pager [phone]  If 7PM-7AM, please contact night-coverage [email or web address] Password [email or web address] 01/09/2013, 1:41 PM

## 2013-01-09 NOTE — Consult Note (Signed)
Referring Provider: No ref. provider found Primary Care Physician:  Avon Gully, MD Primary Gastroenterologist:  Dr. Christella Hartigan  Reason for Consultation:  GIB  HPI: Katie [name] is a 79 y.o. female who has PMH of HTN, anxiety, and glaucoma.  She reports that yesterday she had a couple of episodes of melenic stools.  She was seen at urgent care and Hgb was fairly stable at that point so they discharged her.  Then this AM she had another moderate sized black stool associated with nausea and presyncope.  Hgb at the urgent care showed a 3 gm drop in Hgb since yesterday.  She was then transferred here to [facility] for further management.    She denies any abdominal pain.  Says that she did not vomit, but had dry heaves.  Appetite had been good recently.  Upon reviewing records, it was found that in 2011 she had a GIB and EGD revealed a 4-5 cm friable nodule in the proximal stomach near the GE junction.  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  It was recommended that it be resected due to risk of re-bleeding, however, she never proceeded with that surgery.    Past Medical History  Diagnosis Date  . Glaucoma(365)   . Hypertension   . Anxiety   . GERD (gastroesophageal reflux disease)   . Hemorrhoids   . Hyperlipidemia     Past Surgical History  Procedure Laterality Date  . Abdominal hysterectomy      Prior to Admission medications   Medication Sig Start Date End Date Taking? Authorizing Provider  ALPRAZolam Prudy Feeler) 0.5 MG tablet Take 0.5 mg by mouth at bedtime as needed for sleep.   Yes Historical Provider, MD  benazepril-hydrochlorthiazide (LOTENSIN HCT) 20-25 MG per tablet Take 1 tablet by mouth 2 (two) times daily.    Yes Historical Provider, MD  latanoprost (XALATAN) 0.005 % ophthalmic solution 1 drop at bedtime.   Yes Historical Provider, MD  verapamil (CALAN-SR) 120 MG CR tablet Take 120 mg by mouth 2 (two) times daily.   Yes  Historical Provider, MD    Current Facility-Administered Medications  Medication Dose Route Frequency Provider Last Rate Last Dose  . 0.9 %  sodium chloride infusion   Intravenous STAT Hurman Horn, MD      . acetaminophen (TYLENOL) tablet 650 mg  650 mg Oral Q6H PRN Kathlen Mody, MD       Or  . acetaminophen (TYLENOL) suppository 650 mg  650 mg Rectal Q6H PRN Kathlen Mody, MD      . ALPRAZolam Prudy Feeler) tablet 0.5 mg  0.5 mg Oral QHS PRN Kathlen Mody, MD      . dextrose 5 %-0.9 % sodium chloride infusion   Intravenous Continuous Kathlen Mody, MD 75 mL/hr at 01/09/13 1052    . latanoprost (XALATAN) 0.005 % ophthalmic solution 1 drop  1 drop Both Eyes QHS Kathlen Mody, MD      . oxyCODONE (Oxy IR/ROXICODONE) immediate release tablet 5 mg  5 mg Oral Q4H PRN Kathlen Mody, MD      . Melene Muller ON 01/10/2013] pantoprazole (PROTONIX) injection 40 mg  40 mg Intravenous Daily Kathlen Mody, MD      . promethazine (PHENERGAN) tablet 12.5 mg  12.5 mg Oral Q6H PRN Kathlen Mody, MD      . sodium chloride 0.9 % bolus 1,000 mL  1,000 mL Intravenous Once Kathlen Mody, MD   1,000 mL at 01/09/13 1108  . sodium chloride 0.9 % injection  3 mL  3 mL Intravenous Q12H Kathlen Mody, MD   10 mL at 01/09/13 1053    Allergies as of 01/09/2013  . (No Known Allergies)    Family History  Problem Relation Age of Onset  . Cancer Sister   . Hyperlipidemia Sister   . Hypertension Sister     History   Social History  . Marital Status: Widowed    Spouse Name: N/A    Number of Children: N/A  . Years of Education: N/A   Occupational History  . Not on file.   Social History Main Topics  . Smoking status: Never Smoker   . Smokeless tobacco: Never Used  . Alcohol Use: No  . Drug Use: No  . Sexually Active: Not on file   Other Topics Concern  . Not on file   Social History Narrative  . No narrative on file    Review of Systems: Ten point ROS is O/W negative except as mentioned in HPI.  Physical Exam: Vital  signs in last 24 hours: Temp:  [97.6 F (36.4 C)-98.8 F (37.1 C)] 98.8 F (37.1 C) (04/07 1000) Pulse Rate:  [88-90] 89 (04/07 1051) Resp:  [13-20] 13 (04/07 1051) BP: (81-131)/(44-77) 81/54 mmHg (04/07 1053) SpO2:  [96 %-100 %] 99 % (04/07 1051) FiO2 (%):  [40 %] 40 % (04/07 0733) Weight:  [164 lb 10.9 oz (74.7 kg)] 164 lb 10.9 oz (74.7 kg) (04/07 1000) Last BM Date: 01/09/13 General:   Alert, Well-developed, well-nourished, pleasant and cooperative in NAD Head:  Normocephalic and atraumatic. Eyes:  Sclera clear, no icterus.  Conjunctiva pink. Ears:  Normal auditory acuity. Mouth:  No deformity or lesions.   Lungs:  Clear throughout to auscultation.  No wheezes, crackles, or rhonchi. Heart:  Regular rate and rhythm; no murmurs, clicks, rubs,  or gallops. Abdomen:  Soft, nontender, BS active ,nonpalp mass or hsm.   Rectal:  Deferred  Msk:  Symmetrical without gross deformities. Pulses:  Normal pulses noted. Extremities:  Without clubbing or edema. Neurologic:  Alert and  oriented x4;  grossly normal neurologically. Skin:  Intact without significant lesions or rashes.. Psych:  Alert and cooperative. Normal mood and affect.  Intake/Output this shift: Total I/O In: 2125 [I.V.:2125] Out: 200 [Urine:200]  Lab Results:  Recent Labs  01/08/13 1200 01/09/13 0340  WBC 9.6 11.6*  HGB 11.5* 8.7*  HCT 34.0* 26.5*  PLT 231 251   BMET  Recent Labs  01/08/13 1200 01/09/13 0640  NA 133* 135  K 3.4* 3.3*  CL 96 97  CO2 25 24  GLUCOSE 138* 253*  BUN 29* 42*  CREATININE 0.60 0.70  CALCIUM 9.2 8.9   LFT  Recent Labs  01/09/13 0640  PROT 6.1  ALBUMIN 3.4*  AST 18  ALT 12  ALKPHOS 45  BILITOT 0.3   PT/INR  Recent Labs  01/09/13 0340  LABPROT 13.6  INR 1.05    Studies/Results: Dg Chest Port 1 View  01/09/2013  *RADIOLOGY REPORT*  Clinical Data: Abdominal pain with gaseous distension.  History of hypertension and gastroesophageal reflux.  PORTABLE CHEST - 1  VIEW  Comparison: 02/21/2008 radiographs; CT 08/29/2010.  Findings: 0700 hours.  The heart size and mediastinal contours are stable.  There is a small nodular density superimposed over the posterior aspect of the right ninth rib.  There is no corresponding finding on the prior studies.  The lungs are otherwise clear and there is no pleural effusion or pneumothorax.  Telemetry leads overlie  the chest.  IMPRESSION:  1.  No acute cardiopulmonary process identified. 2.  Cannot exclude right basilar pulmonary nodule.  This could be due to an overlap of vascular and osseous structures. Unless CT is indicated for other reasons, I would suggest initial assessment with a repeat PA view.   Original Report Authenticated By: Carey Bullocks, M.D.     IMPRESSION:  -GIB:  With black melenic stools. -ABLA:  Hgb down 3 grams from yesterday secondary to above. -History of friable gastric mass seen on EGD in 2011, non-malignant, but was recommended that she have it resected (did not proceed with that surgery).   PLAN: -Monitor Hgb and transfuse prn. -NPO and EGD in AM.   ZEHR, JESSICA D.  01/09/2013, 11:09 AM  Pager number [phone]   ________________________________________________________________________  Corinda Gubler GI MD note:  I personally examined the patient, reviewed the data and agree with the assessment and plan described above.  I last saw her about 3 years ago at time of EGD.  There was a mass in proximal stomach which had caused overt bleeding and I advised her very clearly that even though the biopsies of the mass did not prove she had a cancer she should undergo surgical resection. General surgeon advised the same. She declined surgery and did not follow up in my office or in surgery office after that admission.  I suspect she is again bleeding from that site and that the lesion may have grown in the interval 2-3 years. Will plan on EGD tomorrow.   Rob Bunting, MD [facility] Pager  [phone]

## 2013-01-09 NOTE — Progress Notes (Signed)
79 year old lady camein to [facility] for 2 episodes of melanotic stools associated with nausea and vomiting, presyncope and drop in 3 gm of h&H over the last 2 days . She is being admitted to [facility] long hospital to step down for evaluation of GI bleed. Dr Juanda Chance aware the patient admission. Please call Dr Juanda Chance after the patient arrives to step down. Patient also has a pulmonary nodule , needs follow up.     Kathlen Mody, MD [phone]

## 2013-01-09 NOTE — ED Provider Notes (Signed)
History     CSN: [id number]  Arrival date & time 01/09/13  7829   First MD Initiated Contact with Patient 01/09/13 [phone]      Chief Complaint  Patient presents with  . Rectal Bleeding    (Consider location/radiation/quality/duration/timing/severity/associated sxs/prior treatment) HPI This 79 year old who presents with black tarry stool this morning with presyncope, she was seen yesterday in the ED for the same problem and discharged after her hemoglobin was checked at 11.5, she has an appointment today with a new family doctor Dr. Bruna Potter has never seen him before, in November 2011 the patient had melena requiring transfusion and was seen by GI as well as surgery at that time due to we gastric lesion via endoscopy, surgery was recommended at that time, the patient was stable and refused surgery, apparently biopsies of the lesion apparently must have been negative the patient has done well since that time until one episode of melena yesterday with some lightheadedness without abdominal pain and this morning had an episode of transient abdominal pain which is now resolved but had a large melenic stool this morning prior to arrival in a small melenic stool upon arrival to the ED, she is presyncope with generalized weakness again today without chest pain shortness of breath confusion fever or other concerns, there is no treatment prior to arrival.she is not on anticoagulants. Past Medical History  Diagnosis Date  . Glaucoma(365)   . Hypertension   . Anxiety   . GERD (gastroesophageal reflux disease)   . Hemorrhoids   . Hyperlipidemia     Past Surgical History  Procedure Laterality Date  . Abdominal hysterectomy      Family History  Problem Relation Age of Onset  . Cancer Sister   . Hyperlipidemia Sister   . Hypertension Sister     History  Substance Use Topics  . Smoking status: Never Smoker   . Smokeless tobacco: Never Used  . Alcohol Use: No    OB History   Grav Para Term  Preterm Abortions TAB SAB Ect Mult Living                  Review of Systems 10 Systems reviewed and are negative for acute change except as noted in the HPI. Allergies  Review of patient's allergies indicates no known allergies.  Home Medications   Current Outpatient Rx  Name  Route  Sig  Dispense  Refill  . ALPRAZolam (XANAX) 0.25 MG tablet   Oral   Take 0.25 mg by mouth at bedtime as needed.         . ALPRAZolam (XANAX) 0.5 MG tablet   Oral   Take 0.5 mg by mouth at bedtime as needed for sleep.         . benazepril-hydrochlorthiazide (LOTENSIN HCT) 20-25 MG per tablet   Oral   Take 1 tablet by mouth 2 (two) times daily.          Marland Kitchen latanoprost (XALATAN) 0.005 % ophthalmic solution      1 drop at bedtime.         . verapamil (CALAN) 120 MG tablet   Oral   Take 120 mg by mouth 2 (two) times daily.         . verapamil (CALAN-SR) 240 MG CR tablet   Oral   Take 240 mg by mouth at bedtime.           BP 125/58  Pulse 88  Temp(Src) 97.6 F (36.4 C) (Oral)  Resp  16  SpO2 100%  Physical Exam  Nursing note and vitals reviewed. Constitutional:  Awake, alert, nontoxic appearance.  HENT:  Head: Atraumatic.  Eyes: Right eye exhibits no discharge. Left eye exhibits no discharge.  Neck: Neck supple.  Cardiovascular: Normal rate and regular rhythm.   No murmur heard. Pulmonary/Chest: Effort normal and breath sounds normal. No respiratory distress. She has no wheezes. She has no rales. She exhibits no tenderness.  Abdominal: Soft. Bowel sounds are normal. She exhibits no distension and no mass. There is no tenderness. There is no rebound and no guarding.  Genitourinary:  Chaperone present for rectal examination which reveals melenic stool.  Musculoskeletal: She exhibits no tenderness.  Baseline ROM, no obvious new focal weakness.  Neurological: She is alert.  Mental status and motor strength appears baseline for patient and situation.  Skin: No rash noted.   Psychiatric: She has a normal mood and affect.    ED Course  Procedures (including critical care time) ECG: Normal sinus rhythm, ventricular rate 85, normal axis, prolonged QTC of 497 ms, no acute ischemic changes noted, no comparison ECG available  Pt developed recurrent presyncope upon arrival to the ED with generalized weakness but felt much better as soon as she was assisted into a bed and IVF bolus started. 5621  0715: [facility] paging unassigned GI at [facility]. Pt's Hgb dropped 3 points since yesterday but upon arrival is still above 8 today so Oneg blood not started emergently at [facility] ED.  Questionable nodule on portable CXR, Rads recommends PA CXR, which will be deferred at this time in the ED until after Pt is transferred.  D/w Juanda Chance GI who will consult assuming Pt to transfer to [facility] (paged). 0730  D/w [facility] accepted for transfer to stepdown. 3086  Patient informed of clinical course, understand medical decision-making process, and agree with plan.  CRITICAL CARE Performed by: Hurman Horn   Total critical care time:  Critical care time was exclusive of separately billable procedures and treating other patients.  Critical care was necessary to treat or prevent imminent or life-threatening deterioration.  Critical care was time spent personally by me on the following activities: development of treatment plan with patient and/or surrogate as well as nursing, discussions with consultants, evaluation of patient's response to treatment, examination of patient, obtaining history from patient or surrogate, ordering and performing treatments and interventions, ordering and review of laboratory studies, ordering and review of radiographic studies, pulse oximetry and re-evaluation of patient's condition.   Labs Reviewed  CBC WITH DIFFERENTIAL - Abnormal; Notable for the following:    WBC 11.6 (*)    RBC 3.05 (*)    Hemoglobin 8.7 (*)    HCT 26.5 (*)     Neutro Abs 8.2 (*)    All other components within normal limits  COMPREHENSIVE METABOLIC PANEL - Abnormal; Notable for the following:    Potassium 3.3 (*)    Glucose, Bld 253 (*)    BUN 42 (*)    Albumin 3.4 (*)    GFR calc non Af Amer 82 (*)    All other components within normal limits  OCCULT BLOOD X 1 CARD TO LAB, STOOL - Abnormal; Notable for the following:    Fecal Occult Bld POSITIVE (*)    All other components within normal limits  PROTIME-INR  TROPONIN I  LIPASE, BLOOD   Dg Chest Port 1 View  01/09/2013  *RADIOLOGY REPORT*  Clinical Data: Abdominal pain with gaseous distension.  History of hypertension and gastroesophageal reflux.  PORTABLE CHEST - 1 VIEW  Comparison: 02/21/2008 radiographs; CT 08/29/2010.  Findings: 0700 hours.  The heart size and mediastinal contours are stable.  There is a small nodular density superimposed over the posterior aspect of the right ninth rib.  There is no corresponding finding on the prior studies.  The lungs are otherwise clear and there is no pleural effusion or pneumothorax.  Telemetry leads overlie the chest.  IMPRESSION:  1.  No acute cardiopulmonary process identified. 2.  Cannot exclude right basilar pulmonary nodule.  This could be due to an overlap of vascular and osseous structures. Unless CT is indicated for other reasons, I would suggest initial assessment with a repeat PA view.   Original Report Authenticated By: Carey Bullocks, M.D.      1. Acute GI bleeding   2. Anemia   3. Abnormal finding on chest xray       MDM  The patient appears reasonably stabilized for transfer considering the current resources, flow, and capabilities available in the ED at this time, and I doubt any other [facility] requiring further screening and/or treatment in the ED prior to transfer.        Hurman Horn, MD 01/09/13 [phone]

## 2013-01-09 NOTE — ED Notes (Signed)
12 lead EKG completed and given to Dr Fonnie Jarvis copy placed in chart pt appears more alert and orient skin warming and is dry pt response more appropriate to questions BP and HR within normal limits.

## 2013-01-09 NOTE — Progress Notes (Signed)
CARE MANAGEMENT NOTE 01/09/2013  Patient:  Katie [name], Katie [name]   Account Number:  [id number]  Date Initiated:  01/09/2013  Documentation initiated by:  DAVIS,RHONDA  Subjective/Objective Assessment:   pt admitted with syncopy, gi bld, hypotension, hgb 7.5 bld units x2     Action/Plan:   lives at home has children as a support group.   Anticipated DC Date:  01/12/2013   Anticipated DC Plan:  HOME/SELF CARE  In-house referral  NA      DC Planning Services  NA      [facility] Choice  NA   Choice offered to / List presented to:  NA   DME arranged  NA      DME agency  NA     HH arranged  NA      HH agency  NA   Status of service:  In process, will continue to follow Medicare Important Message given?  NA - LOS <3 / Initial given by admissions (If response is "NO", the following Medicare IM given date fields will be blank) Date Medicare IM given:   Date Additional Medicare IM given:    Discharge Disposition:    Per UR Regulation:  Reviewed for med. necessity/level of care/duration of stay  If discussed at Long Length of Stay Meetings, dates discussed:    Comments:  [id number]/Rhonda Earlene Plater, RN, BSN, CCM:  CHART REVIEWED AND UPDATED.  Next chart review due on [id number]. NO DISCHARGE NEEDS PRESENT AT THIS TIME. CASE MANAGEMENT [phone]

## 2013-01-10 ENCOUNTER — Encounter (HOSPITAL_COMMUNITY)
Admission: EM | Disposition: A | Discharge status: Home / Self Care | Payer: Self-pay | Source: Home / Self Care | Attending: Internal Medicine

## 2013-01-10 ENCOUNTER — Encounter (HOSPITAL_COMMUNITY): Payer: Self-pay

## 2013-01-10 ENCOUNTER — Inpatient Hospital Stay (HOSPITAL_COMMUNITY): Payer: Medicare Other

## 2013-01-10 DIAGNOSIS — K922 Gastrointestinal hemorrhage, unspecified: Secondary | ICD-10-CM

## 2013-01-10 DIAGNOSIS — R911 Solitary pulmonary nodule: Secondary | ICD-10-CM

## 2013-01-10 HISTORY — PX: ESOPHAGOGASTRODUODENOSCOPY: SHX5428

## 2013-01-10 HISTORY — PX: BIOPSY STOMACH: PRO33

## 2013-01-10 LAB — IRON AND TIBC
Saturation Ratios: 26 % (ref 20–55)
TIBC: 309 ug/dL (ref 250–470)
UIBC: 230 ug/dL (ref 125–400)

## 2013-01-10 LAB — TYPE AND SCREEN
ABO/RH(D): A POS
Antibody Screen: NEGATIVE
Unit division: 0

## 2013-01-10 LAB — FOLATE: Folate: 14.3 ng/mL

## 2013-01-10 LAB — VITAMIN B12: Vitamin B-12: >2000 pg/mL — ABNORMAL HIGH (ref 211–911)

## 2013-01-10 LAB — HEMOGLOBIN AND HEMATOCRIT, BLOOD: Hemoglobin: 9.2 g/dL — ABNORMAL LOW (ref 12.0–15.0)

## 2013-01-10 SURGERY — EGD (ESOPHAGOGASTRODUODENOSCOPY)
Anesthesia: Moderate Sedation

## 2013-01-10 MED ORDER — POLYETHYLENE GLYCOL 3350 17 GM/SCOOP PO POWD
0.5000 | Freq: Once | ORAL | Status: DC
  Filled 2013-01-10: qty 255

## 2013-01-10 MED ORDER — SODIUM CHLORIDE 0.9 % IV SOLN: INTRAVENOUS | Status: DC

## 2013-01-10 MED ORDER — PEG-KCL-NACL-NASULF-NA ASC-C 100 G PO SOLR
0.5000 | Freq: Once | ORAL | Status: AC
  Administered 2013-01-10: 50 g via ORAL
  Filled 2013-01-10: qty 1

## 2013-01-10 MED ORDER — FERROUS SULFATE 325 (65 FE) MG PO TABS
325.0000 mg | ORAL_TABLET | Freq: Two times a day (BID) | ORAL | Status: DC
  Administered 2013-01-10 – 2013-01-12 (×3): 325 mg via ORAL
  Filled 2013-01-10 (×6): qty 1

## 2013-01-10 MED ORDER — MIDAZOLAM HCL 10 MG/2ML IJ SOLN
INTRAMUSCULAR | Status: AC
  Filled 2013-01-10: qty 4

## 2013-01-10 MED ORDER — CHLORHEXIDINE GLUCONATE 0.12 % MT SOLN: 15.0000 mL | Freq: Two times a day (BID) | OROMUCOSAL | Status: DC

## 2013-01-10 MED ORDER — PEG-KCL-NACL-NASULF-NA ASC-C 100 G PO SOLR: 1.0000 | Freq: Once | ORAL | Status: DC

## 2013-01-10 MED ORDER — IOHEXOL 300 MG/ML  SOLN
25.0000 mL | INTRAMUSCULAR | Status: AC
  Administered 2013-01-10 (×2): 25 mL via ORAL

## 2013-01-10 MED ORDER — MIDAZOLAM HCL 10 MG/2ML IJ SOLN
INTRAMUSCULAR | Status: DC | PRN
  Administered 2013-01-10 (×2): 2 mg via INTRAVENOUS
  Administered 2013-01-10: 1 mg via INTRAVENOUS

## 2013-01-10 MED ORDER — IOHEXOL 300 MG/ML  SOLN
100.0000 mL | Freq: Once | INTRAMUSCULAR | Status: AC | PRN
  Administered 2013-01-10: 100 mL via INTRAVENOUS

## 2013-01-10 MED ORDER — PEG-KCL-NACL-NASULF-NA ASC-C 100 G PO SOLR
0.5000 | Freq: Once | ORAL | Status: AC
  Administered 2013-01-11: 50 g via ORAL

## 2013-01-10 MED ORDER — FENTANYL CITRATE 0.05 MG/ML IJ SOLN
INTRAMUSCULAR | Status: DC | PRN
  Administered 2013-01-10 (×2): 25 ug via INTRAVENOUS

## 2013-01-10 MED ORDER — BUTAMBEN-TETRACAINE-BENZOCAINE 2-2-14 % EX AERO
INHALATION_SPRAY | CUTANEOUS | Status: DC | PRN
  Administered 2013-01-10: 2 via TOPICAL

## 2013-01-10 MED ORDER — FENTANYL CITRATE 0.05 MG/ML IJ SOLN
INTRAMUSCULAR | Status: AC
  Filled 2013-01-10: qty 4

## 2013-01-10 MED ORDER — BIOTENE DRY MOUTH MT LIQD: 15.0000 mL | Freq: Two times a day (BID) | OROMUCOSAL | Status: DC

## 2013-01-10 NOTE — Progress Notes (Signed)
I reviewed CT images, report, spoke with radiologist and Dr. Abbey Chatters.  It appears that she has metastatic rectal cancer.  Will plan on colonoscopy tomorrow for biopsy.  I will order CEA as well.

## 2013-01-10 NOTE — Progress Notes (Addendum)
[facility] PROGRESS NOTE  Katie [name] ZOX:[id number] DOB: [birth year] DOA: 01/09/2013 PCP: Avon Gully, MD  Assessment/Plan: 1. Melanotic stools: probably upper GI bleed from the stomach mass diag 3 years ago.  She was initially admitted to stepdown for monitoring. 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 Biopsies were taken and general surgery consulted for possible resection of the tumour. As per Dr Christella Hartigan she had this tumour for 3 years and has refused resection int he past.  During her hospitalization, she received 2 units prbc and her H&H is stable at 9.2 today. Her anemia panel reveals low ferritin and she was started on iron supplements.  Continue with protonix and SCD's for now.   2. Hypokalemia: repleted as needed. Check BMP in am.   3. Pulmonary nodule int he right basilar lung: would order a CT CHEST with contrast to further evaluate the nodule.    Code Status: full code Family Communication: none at bedside Disposition Plan: pending.    Consultants:  GI  surgery  Procedures: EGD on 01/10/13 :There was a round subepithelial mass in proximal stomach, about 1-2  cm distal to the GE junction. Biopsies taken.     HPI/Subjective: No new complaints.   Objective: Filed Vitals:   01/10/13 0844 01/10/13 0854 01/10/13 0904 01/10/13 0914  BP: 114/67 114/64 120/75 138/76  Pulse:      Temp: 98.4 F (36.9 C)     TempSrc: Oral     Resp: 17 16 16 12   Height:      Weight:      SpO2: 94% 97% 97% 99%    Intake/Output Summary (Last 24 hours) at 01/10/13 1202 Last data filed at 01/10/13 1130  Gross per 24 hour  Intake   3335 ml  Output   2250 ml  Net   1085 ml   Filed Weights   01/09/13 1000  Weight: 74.7 kg (164 lb 10.9 oz)    Exam: Alert afebrile comfortable Cardiovascular: RRR, S1 normal, S2 normal, no MRG, pulses symmetric and intact bilaterally Pulmonary/Chest: CTAB, no wheezes, rales, or  rhonchi Abdominal: Soft. Non-tender, non-distended, bowel sounds are normal, no masses, organomegaly, or guarding present.  Musculoskeletal: No joint deformities, erythema, or stiffness, ROM full and no nontender Neuro; non focal. No facil asymmetry, .   Data Reviewed: Basic Metabolic Panel:  Recent Labs Lab 01/08/13 1200 01/09/13 0640  NA 133* 135  K 3.4* 3.3*  CL 96 97  CO2 25 24  GLUCOSE 138* 253*  BUN 29* 42*  CREATININE 0.60 0.70  CALCIUM 9.2 8.9   Liver Function Tests:  Recent Labs Lab 01/08/13 1200 01/09/13 0640  AST 20 18  ALT 12 12  ALKPHOS 55 45  BILITOT 0.4 0.3  PROT 7.0 6.1  ALBUMIN 3.8 3.4*    Recent Labs Lab 01/08/13 1200 01/09/13 0640  LIPASE 32 22   No results found for this basename: AMMONIA,  in the last 168 hours CBC:  Recent Labs Lab 01/08/13 1200 01/09/13 0340 01/09/13 1104 01/10/13 0226  WBC 9.6 11.6*  --   --   NEUTROABS 7.7 8.2*  --   --   HGB 11.5* 8.7* 7.5* 9.2*  HCT 34.0* 26.5* 22.0* 26.5*  MCV 85.4 86.9  --   --   PLT 231 251  --   --    Cardiac Enzymes:  Recent Labs Lab 01/09/13 0640  TROPONINI <0.30   BNP (last 3  results) No results found for this basename: PROBNP,  in the last 8760 hours CBG: No results found for this basename: GLUCAP,  in the last 168 hours  Recent Results (from the past 240 hour(s))  MRSA PCR SCREENING     Status: None   Collection Time    01/09/13 10:14 AM      Result Value Range Status   MRSA by PCR NEGATIVE  NEGATIVE Final   Comment:            The GeneXpert MRSA Assay (FDA     approved for NASAL specimens     only), is one component of a     comprehensive MRSA colonization     surveillance program. It is not     intended to diagnose MRSA     infection nor to guide or     monitor treatment for     MRSA infections.     Studies: Dg Chest Port 1 View  01/09/2013  *RADIOLOGY REPORT*  Clinical Data: Abdominal pain with gaseous distension.  History of hypertension and  gastroesophageal reflux.  PORTABLE CHEST - 1 VIEW  Comparison: 02/21/2008 radiographs; CT 08/29/2010.  Findings: 0700 hours.  The heart size and mediastinal contours are stable.  There is a small nodular density superimposed over the posterior aspect of the right ninth rib.  There is no corresponding finding on the prior studies.  The lungs are otherwise clear and there is no pleural effusion or pneumothorax.  Telemetry leads overlie the chest.  IMPRESSION:  1.  No acute cardiopulmonary process identified. 2.  Cannot exclude right basilar pulmonary nodule.  This could be due to an overlap of vascular and osseous structures. Unless CT is indicated for other reasons, I would suggest initial assessment with a repeat PA view.   Original Report Authenticated By: Carey Bullocks, M.D.     Scheduled Meds: . ferrous sulfate  325 mg Oral BID WC  . latanoprost  1 drop Both Eyes QHS  . pantoprazole (PROTONIX) IV  40 mg Intravenous Daily  . sodium chloride  3 mL Intravenous Q12H   Continuous Infusions: . sodium chloride 10 mL/hr at 01/10/13 0945  . dextrose 5 % and 0.9% NaCl 75 mL/hr at 01/09/13 1052    Active Problems:   Black tarry stools   GI bleed   Anemia   Leukocytopenia, unspecified   Hypokalemia   Solitary pulmonary nodule        Katie [name]  [facility] Pager [phone]. If 7PM-7AM, please contact night-coverage at [email or web address], password [facility] 01/10/2013, 12:02 PM  LOS: 1 day

## 2013-01-10 NOTE — Progress Notes (Signed)
Pt had several apnea events and would awake and return to normal respirations when apnea alarm sounded.

## 2013-01-10 NOTE — Interval H&P Note (Signed)
History and Physical Interval Note:  01/10/2013 8:10 AM  Tera Helper  has presented today for surgery, with the diagnosis of GIB; history of friable gastric nodule in 2011  The various methods of treatment have been discussed with the patient and family. After consideration of risks, benefits and other options for treatment, the patient has consented to  Procedure(s): ESOPHAGOGASTRODUODENOSCOPY (EGD) (N/A) as a surgical intervention .  The patient's history has been reviewed, patient examined, no change in status, stable for surgery.  I have reviewed the patient's chart and labs.  Questions were answered to the patient's satisfaction.     Rob Bunting

## 2013-01-10 NOTE — H&P (View-Only) (Signed)
Referring Provider: No ref. provider found Primary Care Physician:  FANTA,TESFAYE, MD Primary Gastroenterologist:  Dr. Jacobs  Reason for Consultation:  GIB  HPI: Katie [name] is a 79 y.o. female who has PMH of HTN, anxiety, and glaucoma.  She reports that yesterday she had a couple of episodes of melenic stools.  She was seen at urgent care and Hgb was fairly stable at that point so they discharged her.  Then this AM she had another moderate sized black stool associated with nausea and presyncope.  Hgb at the urgent care showed a 3 gm drop in Hgb since yesterday.  She was then transferred here to [facility] for further management.    She denies any abdominal pain.  Says that she did not vomit, but had dry heaves.  Appetite had been good recently.  Upon reviewing records, it was found that in 2011 she had a GIB and EGD revealed a 4-5 cm friable nodule in the proximal stomach near the GE junction.  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  It was recommended that it be resected due to risk of re-bleeding, however, she never proceeded with that surgery.    Past Medical History  Diagnosis Date  . Glaucoma(365)   . Hypertension   . Anxiety   . GERD (gastroesophageal reflux disease)   . Hemorrhoids   . Hyperlipidemia     Past Surgical History  Procedure Laterality Date  . Abdominal hysterectomy      Prior to Admission medications   Medication Sig Start Date End Date Taking? Authorizing Provider  ALPRAZolam (XANAX) 0.5 MG tablet Take 0.5 mg by mouth at bedtime as needed for sleep.   Yes Historical Provider, MD  benazepril-hydrochlorthiazide (LOTENSIN HCT) 20-25 MG per tablet Take 1 tablet by mouth 2 (two) times daily.    Yes Historical Provider, MD  latanoprost (XALATAN) 0.005 % ophthalmic solution 1 drop at bedtime.   Yes Historical Provider, MD  verapamil (CALAN-SR) 120 MG CR tablet Take 120 mg by mouth 2 (two) times daily.   Yes  Historical Provider, MD    Current Facility-Administered Medications  Medication Dose Route Frequency Provider Last Rate Last Dose  . 0.9 %  sodium chloride infusion   Intravenous STAT John M Bednar, MD      . acetaminophen (TYLENOL) tablet 650 mg  650 mg Oral Q6H PRN Vijaya Akula, MD       Or  . acetaminophen (TYLENOL) suppository 650 mg  650 mg Rectal Q6H PRN Vijaya Akula, MD      . ALPRAZolam (XANAX) tablet 0.5 mg  0.5 mg Oral QHS PRN Vijaya Akula, MD      . dextrose 5 %-0.9 % sodium chloride infusion   Intravenous Continuous Vijaya Akula, MD 75 mL/hr at 01/09/13 1052    . latanoprost (XALATAN) 0.005 % ophthalmic solution 1 drop  1 drop Both Eyes QHS Vijaya Akula, MD      . oxyCODONE (Oxy IR/ROXICODONE) immediate release tablet 5 mg  5 mg Oral Q4H PRN Vijaya Akula, MD      . [START ON 01/10/2013] pantoprazole (PROTONIX) injection 40 mg  40 mg Intravenous Daily Vijaya Akula, MD      . promethazine (PHENERGAN) tablet 12.5 mg  12.5 mg Oral Q6H PRN Vijaya Akula, MD      . sodium chloride 0.9 % bolus 1,000 mL  1,000 mL Intravenous Once Vijaya Akula, MD   1,000 mL at 01/09/13 1108  . sodium chloride 0.9 % injection   3 mL  3 mL Intravenous Q12H Vijaya Akula, MD   10 mL at 01/09/13 1053    Allergies as of 01/09/2013  . (No Known Allergies)    Family History  Problem Relation Age of Onset  . Cancer Sister   . Hyperlipidemia Sister   . Hypertension Sister     History   Social History  . Marital Status: Widowed    Spouse Name: N/A    Number of Children: N/A  . Years of Education: N/A   Occupational History  . Not on file.   Social History Main Topics  . Smoking status: Never Smoker   . Smokeless tobacco: Never Used  . Alcohol Use: No  . Drug Use: No  . Sexually Active: Not on file   Other Topics Concern  . Not on file   Social History Narrative  . No narrative on file    Review of Systems: Ten point ROS is O/W negative except as mentioned in HPI.  Physical Exam: Vital  signs in last 24 hours: Temp:  [97.6 F (36.4 C)-98.8 F (37.1 C)] 98.8 F (37.1 C) (04/07 1000) Pulse Rate:  [88-90] 89 (04/07 1051) Resp:  [13-20] 13 (04/07 1051) BP: (81-131)/(44-77) 81/54 mmHg (04/07 1053) SpO2:  [96 %-100 %] 99 % (04/07 1051) FiO2 (%):  [40 %] 40 % (04/07 0733) Weight:  [164 lb 10.9 oz (74.7 kg)] 164 lb 10.9 oz (74.7 kg) (04/07 1000) Last BM Date: 01/09/13 General:   Alert, Well-developed, well-nourished, pleasant and cooperative in NAD Head:  Normocephalic and atraumatic. Eyes:  Sclera clear, no icterus.  Conjunctiva pink. Ears:  Normal auditory acuity. Mouth:  No deformity or lesions.   Lungs:  Clear throughout to auscultation.  No wheezes, crackles, or rhonchi. Heart:  Regular rate and rhythm; no murmurs, clicks, rubs,  or gallops. Abdomen:  Soft, nontender, BS active ,nonpalp mass or hsm.   Rectal:  Deferred  Msk:  Symmetrical without gross deformities. Pulses:  Normal pulses noted. Extremities:  Without clubbing or edema. Neurologic:  Alert and  oriented x4;  grossly normal neurologically. Skin:  Intact without significant lesions or rashes.. Psych:  Alert and cooperative. Normal mood and affect.  Intake/Output this shift: Total I/O In: 2125 [I.V.:2125] Out: 200 [Urine:200]  Lab Results:  Recent Labs  01/08/13 1200 01/09/13 0340  WBC 9.6 11.6*  HGB 11.5* 8.7*  HCT 34.0* 26.5*  PLT 231 251   BMET  Recent Labs  01/08/13 1200 01/09/13 0640  NA 133* 135  K 3.4* 3.3*  CL 96 97  CO2 25 24  GLUCOSE 138* 253*  BUN 29* 42*  CREATININE 0.60 0.70  CALCIUM 9.2 8.9   LFT  Recent Labs  01/09/13 0640  PROT 6.1  ALBUMIN 3.4*  AST 18  ALT 12  ALKPHOS 45  BILITOT 0.3   PT/INR  Recent Labs  01/09/13 0340  LABPROT 13.6  INR 1.05    Studies/Results: Dg Chest Port 1 View  01/09/2013  *RADIOLOGY REPORT*  Clinical Data: Abdominal pain with gaseous distension.  History of hypertension and gastroesophageal reflux.  PORTABLE CHEST - 1  VIEW  Comparison: 02/21/2008 radiographs; CT 08/29/2010.  Findings: 0700 hours.  The heart size and mediastinal contours are stable.  There is a small nodular density superimposed over the posterior aspect of the right ninth rib.  There is no corresponding finding on the prior studies.  The lungs are otherwise clear and there is no pleural effusion or pneumothorax.  Telemetry leads overlie   the chest.  IMPRESSION:  1.  No acute cardiopulmonary process identified. 2.  Cannot exclude right basilar pulmonary nodule.  This could be due to an overlap of vascular and osseous structures. Unless CT is indicated for other reasons, I would suggest initial assessment with a repeat PA view.   Original Report Authenticated By: William Veazey, M.D.     IMPRESSION:  -GIB:  With black melenic stools. -ABLA:  Hgb down 3 grams from yesterday secondary to above. -History of friable gastric mass seen on EGD in 2011, non-malignant, but was recommended that she have it resected (did not proceed with that surgery).   PLAN: -Monitor Hgb and transfuse prn. -NPO and EGD in AM.   ZEHR, JESSICA D.  01/09/2013, 11:09 AM  Pager number [phone]   ________________________________________________________________________  [facility] MD note:  I personally examined the patient, reviewed the data and agree with the assessment and plan described above.  I last saw her about 3 years ago at time of EGD.  There was a mass in proximal stomach which had caused overt bleeding and I advised her very clearly that even though the biopsies of the mass did not prove she had a cancer she should undergo surgical resection. General surgeon advised the same. She declined surgery and did not follow up in my office or in surgery office after that admission.  I suspect she is again bleeding from that site and that the lesion may have grown in the interval 2-3 years. Will plan on EGD tomorrow.   Katie Jacobs, MD [facility] Pager  [phone]    

## 2013-01-10 NOTE — Op Note (Signed)
[facility] [address] [place], [address]   ENDOSCOPY PROCEDURE REPORT  PATIENT: Caitlin, Ainley.  MR#: [id number] BIRTHDATE: 11/19/1933 , 78  yrs. old GENDER: Female ENDOSCOPIST: Rachael Fee, MD PROCEDURE DATE:  01/10/2013 PROCEDURE:  EGD w/ biopsy ASA CLASS:     Class II INDICATIONS:  melena, anemia; had 3cm mass in proximal stomach noted 2-3 years ago by EGD (me), biopsies did not show cancer but she was recommended by myself and general surgery to have resection since it had been bleeding, she declined. MEDICATIONS: Fentanyl 50 mcg IV and Versed 5 mg IV TOPICAL ANESTHETIC: Cetacaine Spray  DESCRIPTION OF PROCEDURE: After the risks benefits and alternatives of the procedure were thoroughly explained, informed consent was obtained.  The Pentax Gastroscope [id number] endoscope was introduced through the mouth and advanced to the second portion of the duodenum. Without limitations.  The instrument was slowly withdrawn as the mucosa was fully examined.   There was a round subepithelial mass in proximal stomach, about 1-2 cm distal to the GE junction.  The mucosa overlying the mass was hyperemic, inflamed but not neoplastic appearing.  The mass was about 4cm diameter (a bit larger than 3 years ago).  Tunnel biopsies were performed and the mass oozed more than usual blood from the biopsy site.  The site was therefore closed with placement of a single endoclip.  The UGI tract was otherwise normal. Retroflexed views revealed no abnormalities.     The scope was then withdrawn from the patient and the procedure completed.  COMPLICATIONS: There were no complications. ENDOSCOPIC IMPRESSION: There was a round subepithelial mass in proximal stomach, about 1-2 cm distal to the GE junction.  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  The mucosa overlying the mass was hyperemic, inflamed but not neoplastic appearing.  The mass was about 4cm diameter (a bit larger than 3 years ago).   Tunnel biopsies were performed and the mass oozed more than usual blood from the biopsy site.  The site was therefore closed with placement of a single endoclip.  The UGI tract was otherwise normal.  RECOMMENDATIONS: I will order CT scan abd/pelvis to compare to scan in 2011 (mass was felt to be 4.9cm at that time).  We will consult general surgery. This mass is the source of her bleeding and should be resected. These are same recommendations I gave her in 2011.  I suspect GIST.   eSigned:  Rachael Fee, MD 01/10/2013 8:46 AM   CC: Jimmye Norman, MD

## 2013-01-10 NOTE — Consult Note (Signed)
I have seen and examined [name].  Chart reviewed.  CT pending.  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  She decided not to have surgery in 2011.  I spoke with her about the operation-resection of the tumor and part of the stomach.  We discussed the procedure and risks.  Risks include but are not limited to bleeding, infection, wound problems, anesthesia, leak from resection site, injury to other organs, and death.  She initially told me that she did not want to have surgery in [place].  I asked her to speak with her family and think about it.  We will see her again tomorrow.

## 2013-01-10 NOTE — Consult Note (Signed)
Reason for Consult:GI Bleed Referring Physician: Dr. Rob Bunting  Katie [name] is an 79 y.o. female.  HPI: 79 year old female who presents with melanotic stools. She was seen in urgent care where she was initially stable at discharge she went home and had another moderate-sized black stool associated with nausea and presyncope. She was seen at the ER at [facility] and admitted with a 3 g drop in her hemoglobin. She was transfused and stabilized in the ICU. Records show she had a prior 4-5 cm friable nodule in the stomach near the GE junction in 2011. Pathology revealed moderate chronic active gastritis with associated lymphplasmacytic infiltrates and granulation tissue. There was no evidence of metaplasia, dysplasia or malignancy. No evidence of H. Pylori.   She was offered surgical resection at that time but refused, she has never followed up, because she thought this was benign. She was seen in January at  the Urgent care with abdominal discomfort. She was treated with amoxicillin, Prilosec and Biaxin for H. Pylori.  This is the first episode of bleeding she remembers since her bleed in 2011. She was admitted with family 0.7 hematocrit of 26.5. Fecal occult was positive. EGD today by Dr. Christella Hartigan shows a rounded subepithelial mass in the proximal stomach about 1-2 cm distal to the GE junction. The mucosa overlying the mass was hyperemic and inflamed but not neoplastic in appearance. The mass was about 4 cm in diameter.   Biopsies were obtained and the site was marked with an Endo Clip. We are asked to see in consultation for possible resection. 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  We were asked to see in consultation.  Past Medical History  Diagnosis Date  . Glaucoma(365)   . Hypertension   . Anxiety   . GERD (gastroesophageal reflux disease)   . Hemorrhoids   . Hyperlipidemia     Past Surgical History  Procedure Laterality Date  . Abdominal hysterectomy      Family  History  Problem Relation Age of Onset  . Cancer Sister   . Hyperlipidemia Sister   . Hypertension Sister     Social History:  reports that she has never smoked. She has never used smokeless tobacco. She reports that she does not drink alcohol or use illicit drugs.  Allergies: No Known Allergies  Medications:  Prior to Admission:  Prescriptions prior to admission  Medication Sig Dispense Refill  . ALPRAZolam (XANAX) 0.5 MG tablet Take 0.5 mg by mouth at bedtime as needed for sleep.      . benazepril-hydrochlorthiazide (LOTENSIN HCT) 20-25 MG per tablet Take 1 tablet by mouth 2 (two) times daily.       Marland Kitchen latanoprost (XALATAN) 0.005 % ophthalmic solution 1 drop at bedtime.      . verapamil (CALAN-SR) 120 MG CR tablet Take 120 mg by mouth 2 (two) times daily.       Scheduled: . ferrous sulfate  325 mg Oral BID WC  . latanoprost  1 drop Both Eyes QHS  . pantoprazole (PROTONIX) IV  40 mg Intravenous Daily  . sodium chloride  3 mL Intravenous Q12H   Continuous: . sodium chloride 10 mL/hr at 01/10/13 0945  . dextrose 5 % and 0.9% NaCl 75 mL/hr at 01/09/13 1052   ZOX:[id number], acetaminophen, ALPRAZolam, oxyCODONE, promethazine Anti-infectives   None      Results for orders placed during the hospital encounter of 01/09/13 (from the past 48 hour(s))  CBC WITH DIFFERENTIAL  Status: Abnormal   Collection Time    01/09/13  3:40 AM      Result Value Range   WBC 11.6 (*) 4.0 - 10.5 K/uL   RBC 3.05 (*) 3.87 - 5.11 MIL/uL   Hemoglobin 8.7 (*) 12.0 - 15.0 g/dL   Comment: DELTA CHECK NOTED     REPEATED TO VERIFY   HCT 26.5 (*) 36.0 - 46.0 %   MCV 86.9  78.0 - 100.0 fL   MCH 28.5  26.0 - 34.0 pg   MCHC 32.8  30.0 - 36.0 g/dL   RDW 45.4  09.8 - 11.9 %   Platelets 251  150 - 400 K/uL   Neutrophils Relative 71  43 - 77 %   Neutro Abs 8.2 (*) 1.7 - 7.7 K/uL   Lymphocytes Relative 22  12 - 46 %   Lymphs Abs 2.5  0.7 - 4.0 K/uL   Monocytes Relative 7  3 - 12 %   Monocytes  Absolute 0.8  0.1 - 1.0 K/uL   Eosinophils Relative 0  0 - 5 %   Eosinophils Absolute 0.0  0.0 - 0.7 K/uL   Basophils Relative 0  0 - 1 %   Basophils Absolute 0.0  0.0 - 0.1 K/uL  PROTIME-INR     Status: None   Collection Time    01/09/13  3:40 AM      Result Value Range   Prothrombin Time 13.6  11.6 - 15.2 seconds   INR 1.05  0.00 - 1.49  TROPONIN I     Status: None   Collection Time    01/09/13  6:40 AM      Result Value Range   Troponin I <0.30  <0.30 ng/mL   Comment:            Due to the release kinetics of cTnI,     a negative result within the first hours     of the onset of symptoms does not rule out     myocardial infarction with certainty.     If myocardial infarction is still suspected,     repeat the test at appropriate intervals.  COMPREHENSIVE METABOLIC PANEL     Status: Abnormal   Collection Time    01/09/13  6:40 AM      Result Value Range   Sodium 135  135 - 145 mEq/L   Potassium 3.3 (*) 3.5 - 5.1 mEq/L   Chloride 97  96 - 112 mEq/L   CO2 24  19 - 32 mEq/L   Glucose, Bld 253 (*) 70 - 99 mg/dL   BUN 42 (*) 6 - 23 mg/dL   Creatinine, Ser 1.47  0.50 - 1.10 mg/dL   Calcium 8.9  8.4 - 82.9 mg/dL   Total Protein 6.1  6.0 - 8.3 g/dL   Albumin 3.4 (*) 3.5 - 5.2 g/dL   AST 18  0 - 37 U/L   ALT 12  0 - 35 U/L   Alkaline Phosphatase 45  39 - 117 U/L   Total Bilirubin 0.3  0.3 - 1.2 mg/dL   GFR calc non Af Amer 82 (*) >90 mL/min   GFR calc Af Amer >90  >90 mL/min   Comment:            The eGFR has been calculated     using the CKD EPI equation.     This calculation has not been     validated in all clinical  situations.     eGFR's persistently     <90 mL/min signify     possible Chronic Kidney Disease.  LIPASE, BLOOD     Status: None   Collection Time    01/09/13  6:40 AM      Result Value Range   Lipase 22  11 - 59 U/L  OCCULT BLOOD X 1 CARD TO LAB, STOOL     Status: Abnormal   Collection Time    01/09/13  7:00 AM      Result Value Range   Fecal  Occult Bld POSITIVE (*) NEGATIVE  MRSA PCR SCREENING     Status: None   Collection Time    01/09/13 10:14 AM      Result Value Range   MRSA by PCR NEGATIVE  NEGATIVE   Comment:            The GeneXpert MRSA Assay (FDA     approved for NASAL specimens     only), is one component of a     comprehensive MRSA colonization     surveillance program. It is not     intended to diagnose MRSA     infection nor to guide or     monitor treatment for     MRSA infections.  HEMOGLOBIN AND HEMATOCRIT, BLOOD     Status: Abnormal   Collection Time    01/09/13 11:04 AM      Result Value Range   Hemoglobin 7.5 (*) 12.0 - 15.0 g/dL   HCT 16.1 (*) 09.6 - 04.5 %  RETICULOCYTES     Status: Abnormal   Collection Time    01/09/13 11:04 AM      Result Value Range   Retic Ct Pct 2.7  0.4 - 3.1 %   RBC. 2.58 (*) 3.87 - 5.11 MIL/uL   Retic Count, Manual 69.7  19.0 - 186.0 K/uL  TSH     Status: None   Collection Time    01/09/13 11:43 AM      Result Value Range   TSH 0.468  0.350 - 4.500 uIU/mL  HEMOGLOBIN A1C     Status: Abnormal   Collection Time    01/09/13 11:43 AM      Result Value Range   Hemoglobin A1C 6.4 (*) <5.7 %   Comment: (NOTE)                                                                               According to the ADA Clinical Practice Recommendations for 2011, when     HbA1c is used as a screening test:      >=6.5%   Diagnostic of Diabetes Mellitus               (if abnormal result is confirmed)     5.7-6.4%   Increased risk of developing Diabetes Mellitus     References:Diagnosis and Classification of Diabetes Mellitus,Diabetes     Care,2011,34(Suppl 1):S62-S69 and Standards of Medical Care in             Diabetes - 2011,Diabetes Care,2011,34 (Suppl 1):S11-S61.   Mean Plasma Glucose 137 (*) <117 mg/dL  URINALYSIS, ROUTINE W REFLEX MICROSCOPIC  Status: Abnormal   Collection Time    01/09/13 12:24 PM      Result Value Range   Color, Urine YELLOW  YELLOW   APPearance  CLEAR  CLEAR   Specific Gravity, Urine 1.013  1.005 - 1.030   pH 6.5  5.0 - 8.0   Glucose, UA NEGATIVE  NEGATIVE mg/dL   Hgb urine dipstick TRACE (*) NEGATIVE   Bilirubin Urine NEGATIVE  NEGATIVE   Ketones, ur NEGATIVE  NEGATIVE mg/dL   Protein, ur NEGATIVE  NEGATIVE mg/dL   Urobilinogen, UA 0.2  0.0 - 1.0 mg/dL   Nitrite NEGATIVE  NEGATIVE   Leukocytes, UA SMALL (*) NEGATIVE  URINE MICROSCOPIC-ADD ON     Status: None   Collection Time    01/09/13 12:24 PM      Result Value Range   Squamous Epithelial / LPF RARE  RARE   WBC, UA 3-6  <3 WBC/hpf   Bacteria, UA RARE  RARE   Urine-Other RENAL TUBULAR EPITHELIALS PRESENT    ABO/RH     Status: None   Collection Time    01/09/13  2:00 PM      Result Value Range   ABO/RH(D) A POS    VITAMIN B12     Status: Abnormal   Collection Time    01/09/13  2:02 PM      Result Value Range   Vitamin B-12 >2000 (*) 211 - 911 pg/mL  FOLATE     Status: None   Collection Time    01/09/13  2:02 PM      Result Value Range   Folate 14.3     Comment: (NOTE)     Reference Ranges            Deficient:       0.4 - 3.3 ng/mL            Indeterminate:   3.4 - 5.4 ng/mL            Normal:              > 5.4 ng/mL  IRON AND TIBC     Status: None   Collection Time    01/09/13  2:02 PM      Result Value Range   Iron 79  42 - 135 ug/dL   TIBC 846  962 - 952 ug/dL   Saturation Ratios 26  20 - 55 %   UIBC 230  125 - 400 ug/dL  FERRITIN     Status: None   Collection Time    01/09/13  2:02 PM      Result Value Range   Ferritin 14  10 - 291 ng/mL  PREPARE RBC (CROSSMATCH)     Status: None   Collection Time    01/09/13  2:02 PM      Result Value Range   Order Confirmation ORDER PROCESSED BY BLOOD BANK    TYPE AND SCREEN     Status: None   Collection Time    01/09/13  2:02 PM      Result Value Range   ABO/RH(D) A POS     Antibody Screen NEG     Sample Expiration 01/12/2013     Unit Number [id number]     Blood Component Type RED CELLS,LR      Unit division 00     Status of Unit ISSUED,FINAL     Transfusion Status OK TO TRANSFUSE     Crossmatch Result Compatible  Unit Number [id number]     Blood Component Type RED CELLS,LR     Unit division 00     Status of Unit ISSUED,FINAL     Transfusion Status OK TO TRANSFUSE     Crossmatch Result Compatible    HEMOGLOBIN AND HEMATOCRIT, BLOOD     Status: Abnormal   Collection Time    01/10/13  2:26 AM      Result Value Range   Hemoglobin 9.2 (*) 12.0 - 15.0 g/dL   Comment: POST TRANSFUSION SPECIMEN     DELTA CHECK NOTED   HCT 26.5 (*) 36.0 - 46.0 %    Dg Chest Port 1 View  01/09/2013  *RADIOLOGY REPORT*  Clinical Data: Abdominal pain with gaseous distension.  History of hypertension and gastroesophageal reflux.  PORTABLE CHEST - 1 VIEW  Comparison: 02/21/2008 radiographs; CT 08/29/2010.  Findings: 0700 hours.  The heart size and mediastinal contours are stable.  There is a small nodular density superimposed over the posterior aspect of the right ninth rib.  There is no corresponding finding on the prior studies.  The lungs are otherwise clear and there is no pleural effusion or pneumothorax.  Telemetry leads overlie the chest.  IMPRESSION:  1.  No acute cardiopulmonary process identified. 2.  Cannot exclude right basilar pulmonary nodule.  This could be due to an overlap of vascular and osseous structures. Unless CT is indicated for other reasons, I would suggest initial assessment with a repeat PA view.   Original Report Authenticated By: Carey Bullocks, M.D.     Review of Systems  Constitutional: Positive for weight loss. Negative for fever, chills, malaise/fatigue and diaphoresis.  HENT:       Syncope in the ER after bleed.  Eyes: Negative.        Glaucoma  Respiratory: Negative.   Cardiovascular: Negative.   Gastrointestinal: Positive for heartburn (occasoinal), constipation (some before bleed) and blood in stool (after hospitalization, none prior.). Negative for nausea,  vomiting, abdominal pain and diarrhea.  Genitourinary: Negative.   Musculoskeletal:       "sciatiica and some arthritis.  Skin: Negative.   Neurological: Negative for weakness.  Psychiatric/Behavioral: The patient is nervous/anxious.    Blood pressure 126/61, pulse 73, temperature 98.4 F (36.9 C), temperature source Oral, resp. rate 16, height 5\' 3"  (1.6 m), weight 164 lb 10.9 oz (74.7 kg), SpO2 98.00%. Physical Exam  Constitutional: She is oriented to person, place, and time. She appears well-developed and well-nourished. No distress.  Up in chair eating.  HENT:  Head: Normocephalic and atraumatic.  Nose: Nose normal.  Mouth/Throat: Oropharynx is clear and moist.  Eyes: Conjunctivae and EOM are normal. Pupils are equal, round, and reactive to light. Right eye exhibits no discharge. Left eye exhibits no discharge. No scleral icterus.  Neck: Normal range of motion. Neck supple. No JVD present. No tracheal deviation present. No thyromegaly present.  Cardiovascular: Normal rate, regular rhythm, normal heart sounds and intact distal pulses.  Exam reveals no gallop.   No murmur heard. Respiratory: Effort normal and breath sounds normal. No respiratory distress. She has no wheezes. She has no rales. She exhibits no tenderness.  GI: Soft. Bowel sounds are normal. She exhibits no distension and no mass. There is no tenderness. There is no rebound and no guarding.  Musculoskeletal: Normal range of motion. She exhibits no edema and no tenderness.  Lymphadenopathy:    She has no cervical adenopathy.  Neurological: She is alert and oriented to person, place, and time.  No cranial nerve deficit.  Skin: Skin is warm and dry. No rash noted. She is not diaphoretic. No erythema.  Psychiatric: She has a normal mood and affect. Her behavior is normal. Judgment and thought content normal.    Assessment/Plan: 1. 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 Prior history of mass with biopsy 09/02/2010; resection  was declined at that time. 3. Chronic GERD prior GI bleed 2011,  4. Hypertension 6. Sciatica  7. Hyperlipidemia 8. Past history of hemorrhoids 9. Glaucoma  Plan: We will follow with you await pathology with further recommendations to follow after path and CT are completed. This is will [place] PA for Dr. Avel Peace.  Katie [name] 01/10/2013, 2:29 PM

## 2013-01-11 ENCOUNTER — Telehealth: Payer: Self-pay | Admitting: *Deleted

## 2013-01-11 ENCOUNTER — Telehealth: Payer: Self-pay

## 2013-01-11 ENCOUNTER — Encounter (HOSPITAL_COMMUNITY)
Admission: EM | Disposition: A | Discharge status: Home / Self Care | Payer: Self-pay | Source: Home / Self Care | Attending: Internal Medicine

## 2013-01-11 ENCOUNTER — Encounter (HOSPITAL_COMMUNITY): Payer: Self-pay | Admitting: Gastroenterology

## 2013-01-11 DIAGNOSIS — C20 Malignant neoplasm of rectum: Secondary | ICD-10-CM

## 2013-01-11 DIAGNOSIS — C801 Malignant (primary) neoplasm, unspecified: Secondary | ICD-10-CM

## 2013-01-11 DIAGNOSIS — R933 Abnormal findings on diagnostic imaging of other parts of digestive tract: Secondary | ICD-10-CM

## 2013-01-11 HISTORY — PX: BIOPSY RECTAL: PRO29

## 2013-01-11 HISTORY — DX: Malignant (primary) neoplasm, unspecified: C80.1

## 2013-01-11 HISTORY — PX: COLONOSCOPY: SHX5424

## 2013-01-11 LAB — CBC
Hemoglobin: 9.4 g/dL — ABNORMAL LOW (ref 12.0–15.0)
Hemoglobin: 8.9 g/dL — ABNORMAL LOW (ref 12.0–15.0)
Platelets: 157 10*3/uL (ref 150–400)
RBC: 3.25 MIL/uL — ABNORMAL LOW (ref 3.87–5.11)
RBC: 3.06 MIL/uL — ABNORMAL LOW (ref 3.87–5.11)
WBC: 5.5 10*3/uL (ref 4.0–10.5)

## 2013-01-11 LAB — BASIC METABOLIC PANEL
CO2: 24 mEq/L (ref 19–32)
Chloride: 109 mEq/L (ref 96–112)
Glucose, Bld: 118 mg/dL — ABNORMAL HIGH (ref 70–99)
Sodium: 143 mEq/L (ref 135–145)

## 2013-01-11 SURGERY — COLONOSCOPY
Anesthesia: Moderate Sedation

## 2013-01-11 MED ORDER — MIDAZOLAM HCL 10 MG/2ML IJ SOLN
INTRAMUSCULAR | Status: DC | PRN
  Administered 2013-01-11 (×4): 2 mg via INTRAVENOUS

## 2013-01-11 MED ORDER — FERROUS SULFATE 325 (65 FE) MG PO TABS: 325.0000 mg | ORAL_TABLET | Freq: Three times a day (TID) | ORAL | Status: DC

## 2013-01-11 MED ORDER — MIDAZOLAM HCL 10 MG/2ML IJ SOLN
INTRAMUSCULAR | Status: AC
  Filled 2013-01-11: qty 4

## 2013-01-11 MED ORDER — FENTANYL CITRATE 0.05 MG/ML IJ SOLN
INTRAMUSCULAR | Status: AC
  Filled 2013-01-11: qty 4

## 2013-01-11 MED ORDER — DIPHENHYDRAMINE HCL 50 MG/ML IJ SOLN
INTRAMUSCULAR | Status: AC
  Filled 2013-01-11: qty 1

## 2013-01-11 MED ORDER — POTASSIUM CHLORIDE CRYS ER 20 MEQ PO TBCR
30.0000 meq | EXTENDED_RELEASE_TABLET | Freq: Once | ORAL | Status: AC
  Administered 2013-01-11: 30 meq via ORAL
  Filled 2013-01-11: qty 1

## 2013-01-11 MED ORDER — FENTANYL CITRATE 0.05 MG/ML IJ SOLN
INTRAMUSCULAR | Status: DC | PRN
  Administered 2013-01-11 (×4): 25 ug via INTRAVENOUS

## 2013-01-11 MED ORDER — SODIUM CHLORIDE 0.9 % IV SOLN: INTRAVENOUS | Status: DC

## 2013-01-11 NOTE — Telephone Encounter (Signed)
Met with patient regarding referral from Dr. Christella Hartigan for medical and radiation oncology.  The patient stated she did not want to accept any appointments until she is given a diagnosis.  She stated she wants her biopsy and xray reports.  This RN explained role of GI navigator and gave contact information.  This RN will contact the patient in a few days after she speaks with her doctor per request of the patient.  Dr Christella Hartigan informed.

## 2013-01-11 NOTE — Interval H&P Note (Signed)
History and Physical Interval Note:  01/11/2013 8:59 AM  Tera Helper  has presented today for surgery, with the diagnosis of abnormal CT scan  The various methods of treatment have been discussed with the patient and family. After consideration of risks, benefits and other options for treatment, the patient has consented to  Procedure(s): COLONOSCOPY (N/A) as a surgical intervention .  The patient's history has been reviewed, patient examined, no change in status, stable for surgery.  I have reviewed the patient's chart and labs.  Questions were answered to the patient's satisfaction.     Rob Bunting

## 2013-01-11 NOTE — Op Note (Signed)
[facility] [address] [place], [address]   COLONOSCOPY PROCEDURE REPORT  PATIENT: Katie [name], Katie [name].  MR#: [id number] BIRTHDATE: October 23, 1933 , 78  yrs. 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 MEDICATIONS: Fentanyl 100 mcg IV and Versed 8 mg IV  DESCRIPTION OF PROCEDURE:   After the risks benefits and alternatives of the procedure were thoroughly explained, informed consent was obtained.  A digital rectal exam revealed no abnormalities of the rectum.   The Pentax Ped Colon [id number] endoscope was introduced through the anus and advanced to the cecum, which was identified by both the appendix and ileocecal valve. No adverse events experienced.   The quality of the prep was fair.  The instrument was then slowly withdrawn as the colon was fully examined.  COLON FINDINGS: There was a 5cm long, circumferential, near obstructing mass in proximal rectum with distal edge 8-9cm from anal verge.  The mass was ulcerated, fungating and it was biopsied. There was a broad based 3cm polyp in sigmoid that was slightly firm, perhaps houses canncer as well.  I elected not to biopsy or remove this lesion.  There was diverticulosis throughout the colon. The examination was otherwise normal.  Retroflexed views revealed no abnormalities. The time to cecum=4 minutes 00 seconds. Withdrawal time=8 minutes 00 seconds.  The scope was withdrawn and the procedure completed. COMPLICATIONS: There were no complications.  ENDOSCOPIC IMPRESSION: There was a 5cm long, circumferential, near obstructing mass in proximal rectum with distal edge 8-9cm from anal verge.  The mass was ulcerated, fungating and it was biopsied.  There was a broad based 3cm polyp in  sigmoid that was slightly firm, perhaps houses canncer as well.  I elected not to biopsy or remove this lesion. There was diverticulosis throughout the colon.  The examination was otherwise normal.  RECOMMENDATIONS: She is OK to be discharged home today.  She will new patient appts with medical and radiation oncology which I will help to set up. Her case will also be discussed at an upcoming GI tumor board (large GIST-like gastric mass as well as metastatic rectal cancer).   eSigned:  Rachael Fee, MD 01/11/2013 9:28 AM

## 2013-01-11 NOTE — Telephone Encounter (Signed)
Gina sent a message and referral has been received she will try and get expedited  appts and will put pt on GI tumor board

## 2013-01-11 NOTE — Progress Notes (Signed)
[facility] PROGRESS NOTE  Katie [name] WJX:[id number] DOB: 07-Nov-1933 DOA: 01/09/2013 PCP: Avon Gully, MD  Assessment/Plan: Melanotic stools: probably upper GI bleed from the stomach mass diag 3 years ago.  She was initially admitted to stepdown for monitoring. 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 Biopsies were taken and general surgery consulted for possible resection of the tumour. As per Dr Christella Hartigan she had this tumour for 3 years and has refused resection int he past.  During her hospitalization, she received 2 units prbc and her H&H is stable at 9.2 today. Her anemia panel reveals low ferritin and she was started on iron supplements.  Continue with protonix and SCD's for now.  - monitoring CBC - she is now considering surgery if that is the case, however she likely has stage IV rectal cancer  Hypokalemia: repleted as needed. Check BMP in am.   Pulmonary nodule int he right basilar lung:  - likely mets   Code Status: full code Family Communication: none at bedside Disposition Plan: pending.   Consultants:  GI  surgery  Procedures: EGD on 01/10/13 :There was a round subepithelial mass in proximal stomach, about 1-2  cm distal to the GE junction. Biopsies taken.   CSY There was a 5cm long, circumferential, near obstructing mass in  proximal rectum with distal edge 8-9cm from anal verge. The mass  was ulcerated, fungating and it was biopsied. There was a broad  based 3cm polyp in sigmoid that was slightly firm, perhaps houses  canncer as well. I elected not to biopsy or remove this lesion.  There was diverticulosis throughout the colon. The examination was  otherwise normal.  HPI/Subjective: No new complaints.   Objective: Filed Vitals:   01/11/13 0940 01/11/13 0950 01/11/13 1000 01/11/13 1333  BP: 124/72 141/80 137/77 114/59  Pulse:    69  Temp:    98.7 F (37.1 C)  TempSrc:    Oral  Resp: 13 16  14 16   Height:      Weight:      SpO2: 100% 97% 99% 100%    Intake/Output Summary (Last 24 hours) at 01/11/13 1702 Last data filed at 01/11/13 1500  Gross per 24 hour  Intake   1890 ml  Output      0 ml  Net   1890 ml   Filed Weights   01/09/13 1000  Weight: 74.7 kg (164 lb 10.9 oz)    Exam: Alert afebrile comfortable Cardiovascular: RRR, S1 normal, S2 normal, no MRG, pulses symmetric and intact bilaterally Pulmonary/Chest: CTAB, no wheezes, rales, or rhonchi Abdominal: Soft. Non-tender, non-distended, bowel sounds are normal, no masses, organomegaly, or guarding present.  Musculoskeletal: No joint deformities, erythema, or stiffness, ROM full and no nontender Neuro; non focal. No facil asymmetry, . Data Reviewed: Basic Metabolic Panel:  Recent Labs Lab 01/08/13 1200 01/09/13 0640 01/11/13 0507  NA 133* 135 143  K 3.4* 3.3* 3.1*  CL 96 97 109  CO2 25 24 24   GLUCOSE 138* 253* 118*  BUN 29* 42* 7  CREATININE 0.60 0.70 0.63  CALCIUM 9.2 8.9 8.5   Liver Function Tests:  Recent Labs Lab 01/08/13 1200 01/09/13 0640  AST 20 18  ALT 12 12  ALKPHOS 55 45  BILITOT 0.4 0.3  PROT 7.0 6.1  ALBUMIN 3.8 3.4*    Recent Labs Lab 01/08/13 1200 01/09/13 0640  LIPASE 32 22   CBC:  Recent Labs  Lab 01/08/13 1200 01/09/13 0340 01/09/13 1104 01/10/13 0226 01/11/13 0507 01/11/13 1435  WBC 9.6 11.6*  --   --  5.5 5.7  NEUTROABS 7.7 8.2*  --   --   --   --   HGB 11.5* 8.7* 7.5* 9.2* 9.4* 8.9*  HCT 34.0* 26.5* 22.0* 26.5* 27.4* 25.9*  MCV 85.4 86.9  --   --  84.3 84.6  PLT 231 251  --   --  157 159   Cardiac Enzymes:  Recent Labs Lab 01/09/13 0640  TROPONINI <0.30    Recent Results (from the past 240 hour(s))  MRSA PCR SCREENING     Status: None   Collection Time    01/09/13 10:14 AM      Result Value Range Status   MRSA by PCR NEGATIVE  NEGATIVE Final   Comment:            The GeneXpert MRSA Assay (FDA     approved for NASAL specimens     only), is  one component of a     comprehensive MRSA colonization     surveillance program. It is not     intended to diagnose MRSA     infection nor to guide or     monitor treatment for     MRSA infections.     Studies: Ct Chest W Contrast  01/10/2013  **ADDENDUM** CREATED: 01/10/2013 16:20:52  These results were called by telephone on 01/10/2013 at 1620 hours to Dr. Rob Bunting, who verbally acknowledged these results.  **END ADDENDUM** SIGNED BY: Charline Bills, M.D.   01/10/2013  *RADIOLOGY REPORT*  Clinical Data:  Gastric mass, abnormal chest radiograph  CT CHEST, ABDOMEN AND PELVIS WITH CONTRAST  Technique:  Multidetector CT imaging of the chest, abdomen and pelvis was performed following the standard protocol during bolus administration of intravenous contrast.  Contrast: OMNIPAQUE IOHEXOL 300 MG/ML  SOLN  Comparison:  CT chest abdomen pelvis dated 08/29/2010.  Chest radiographs dated 01/09/2013.  CT CHEST  Findings:  Numerous bilateral pulmonary nodules, suspicious for metastases, including: 8 x 6 mm right upper lobe pulmonary nodule (series 4/image 10) 8 x 4 mm left upper lobe pulmonary nodule (series 4/image 22) 6 x 8 mm right lower lobe pulmonary nodule (series 4/image 30) 8 x 9 mm left lower lobe pulmonary nodule (series 4/image 38)  A few of these lesions are cavitary, for example in the bilateral upper lobes (series 4/images 11 and 15).  No pleural effusion or pneumothorax.  Visualized thyroid is unremarkable.  The heart is top normal in size.  No pericardial effusion. Coronary atherosclerosis.  Atherosclerotic calcifications of the aortic arch.  Thoracic lymphadenopathy, including: --9 mm short-axis superior mediastinal node (series 2/image 13) --14 mm short-axis AP window node (series 2/image 18) --10 mm short-axis precarinal node (series 2/image 20) --12 mm short-axis left hilar node (series 2/image 22) --9 mm short-axis subcarinal node (series 2/image 25)  Intramuscular lipoma in the left  back (series 2/image 11).  Degenerative changes of the thoracic spine.  IMPRESSION: Numerous bilateral pulmonary nodules/metastases, measuring up to 9 mm, as described above.  Associated mild thoracic nodal metastases, as described above.  CT ABDOMEN AND PELVIS  Findings:  5.5 x 4.3 cm submucosal gastric mass along the lesser curvature (series 2/image 48), previously 4.9 x 3.3 cm in 2011.  7 mm probable cyst in the hepatic dome (series 2/image 41), unchanged. 11 x 7 mm hypoenhancing lesion in the lateral segment left hepatic lobe (series  2/image 49), new, suspicious.  Spleen, pancreas, and adrenal glands are within normal limits.  Gallbladder is unremarkable.  No intrahepatic or extrahepatic ductal dilatation.  Kidneys are within normal limits.  No hydronephrosis.  No evidence of bowel obstruction.  Normal appendix.  Eccentric mass near the rectosigmoid junction (series 2/image 101), compatible with rectal adenocarcinoma.  Associated presacral stranding/soft tissue (series 2/image 103). Adjacent perirectal lymph nodes measuring up to 10 mm short axis (series 2/images 96 and 102).  Additional retroperitoneal/pelvic lymphadenopathy, including: --14 mm short-axis left para-aortic node (series 2/image 64) --11 mm short-axis left common iliac node (series 2/image 82) --13 mm short axis left common iliac node (series 2/image 91) --9 mm short-axis left external iliac node (series 2/image 97)  Atherosclerotic calcifications of the abdominal aorta and branch vessels.  No abdominopelvic ascites.  Status post hysterectomy.  Bilateral ovaries are unremarkable.  Bladder is within normal limits.  Degenerative changes of the lumbar spine.  IMPRESSION: Eccentric mass near the rectosigmoid junction, compatible with primary rectal adenocarcinoma.  Associated retroperitoneal/pelvic nodal metastases, as described above.  11 mm hypoenhancing lesion in the lateral segment left hepatic lobe, suspicious for metastasis.  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  Original Report Authenticated By: Charline Bills, M.D.    Ct Abdomen Pelvis W Contrast  01/10/2013  **ADDENDUM** CREATED: 01/10/2013 16:20:52  These results were called by telephone on 01/10/2013 at 1620 hours to Dr. Rob Bunting, who verbally acknowledged these results.  **END ADDENDUM** SIGNED BY: Charline Bills, M.D.   01/10/2013  *RADIOLOGY REPORT*  Clinical Data:  Gastric mass, abnormal chest radiograph  CT CHEST, ABDOMEN AND PELVIS WITH CONTRAST  Technique:  Multidetector CT imaging of the chest, abdomen and pelvis was performed following the standard protocol during bolus administration of intravenous contrast.  Contrast: OMNIPAQUE IOHEXOL 300 MG/ML  SOLN  Comparison:  CT chest abdomen pelvis dated 08/29/2010.  Chest radiographs dated 01/09/2013.  CT CHEST  Findings:  Numerous bilateral pulmonary nodules, suspicious for metastases, including: 8 x 6 mm right upper lobe pulmonary nodule (series 4/image 10) 8 x 4 mm left upper lobe pulmonary nodule (series 4/image 22) 6 x 8 mm right lower lobe pulmonary nodule (series 4/image 30) 8 x 9 mm left lower lobe pulmonary nodule (series 4/image 38)  A few of these lesions are cavitary, for example in the bilateral upper lobes (series 4/images 11 and 15).  No pleural effusion or pneumothorax.  Visualized thyroid is unremarkable.  The heart is top normal in size.  No pericardial effusion. Coronary atherosclerosis.  Atherosclerotic calcifications of the aortic arch.  Thoracic lymphadenopathy, including: --9 mm short-axis superior mediastinal node (series 2/image 13) --14 mm short-axis AP window node (series 2/image 18) --10 mm short-axis precarinal node (series 2/image 20) --12 mm short-axis left hilar node (series 2/image 22) --9 mm short-axis subcarinal node (series 2/image 25)  Intramuscular lipoma in the left back (series 2/image 11).   Degenerative changes of the thoracic spine.  IMPRESSION: Numerous bilateral pulmonary nodules/metastases, measuring up to 9 mm, as described above.  Associated mild thoracic nodal metastases, as described above.  CT ABDOMEN AND PELVIS  Findings:  5.5 x 4.3 cm submucosal gastric mass along the lesser curvature (series 2/image 48), previously 4.9 x 3.3 cm in 2011.  7 mm probable cyst in the hepatic dome (series 2/image 41), unchanged. 11 x 7 mm hypoenhancing lesion in the lateral segment left hepatic lobe (  series 2/image 49), new, suspicious.  Spleen, pancreas, and adrenal glands are within normal limits.  Gallbladder is unremarkable.  No intrahepatic or extrahepatic ductal dilatation.  Kidneys are within normal limits.  No hydronephrosis.  No evidence of bowel obstruction.  Normal appendix.  Eccentric mass near the rectosigmoid junction (series 2/image 101), compatible with rectal adenocarcinoma.  Associated presacral stranding/soft tissue (series 2/image 103). Adjacent perirectal lymph nodes measuring up to 10 mm short axis (series 2/images 96 and 102).  Additional retroperitoneal/pelvic lymphadenopathy, including: --14 mm short-axis left para-aortic node (series 2/image 64) --11 mm short-axis left common iliac node (series 2/image 82) --13 mm short axis left common iliac node (series 2/image 91) --9 mm short-axis left external iliac node (series 2/image 97)  Atherosclerotic calcifications of the abdominal aorta and branch vessels.  No abdominopelvic ascites.  Status post hysterectomy.  Bilateral ovaries are unremarkable.  Bladder is within normal limits.  Degenerative changes of the lumbar spine.  IMPRESSION: Eccentric mass near the rectosigmoid junction, compatible with primary rectal adenocarcinoma.  Associated retroperitoneal/pelvic nodal metastases, as described above.  11 mm hypoenhancing lesion in the lateral segment left hepatic lobe, suspicious for metastasis.  5.5 x 4.3 cm submucosal gastric mass along the  lesser curvature, mildly increased since 2011, likely reflecting a benign GIST.  Original Report Authenticated By: Charline Bills, M.D.     Scheduled Meds: . ferrous sulfate  325 mg Oral BID WC  . latanoprost  1 drop Both Eyes QHS  . pantoprazole (PROTONIX) IV  40 mg Intravenous Daily  . sodium chloride  3 mL Intravenous Q12H   Continuous Infusions: . dextrose 5 % and 0.9% NaCl 75 mL/hr at 01/11/13 1610    Active Problems:   Black tarry stools   GI bleed   Anemia   Leukocytopenia, unspecified   Hypokalemia   Solitary pulmonary nodule   Rectal cancer   Pamella Pert  [facility] Pager [phone]. If 7PM-7AM, please contact night-coverage at [email or web address], password [facility] 01/11/2013, 5:02 PM  LOS: 2 days

## 2013-01-11 NOTE — Telephone Encounter (Signed)
Katie [name] I put the referral in for Radiation.

## 2013-01-11 NOTE — Progress Notes (Signed)
Patient ID: Katie [name], female   DOB: [birth year], 79 y.o.   MRN: [id number] 1 Day Post-Op  Subjective: Pt very upset about me seeing her today.  She reports she never asked for surgery to see her and she does not want [place] to see her anymore.  She is insistent that she will not have surgery in [place] therefore there is no reason for [place] to see her.  She also said she will not pay for [place] seeing her because she was not asked if it was OK with her for [place] to see her.     Objective: Vital signs in last 24 hours: Temp:  [97.8 F (36.6 C)-98.4 F (36.9 C)] 98.4 F (36.9 C) (04/09 0535) Pulse Rate:  [73-91] 76 (04/09 0535) Resp:  [12-28] 18 (04/09 0535) BP: (108-175)/(55-92) 135/62 mmHg (04/09 0535) SpO2:  [94 %-100 %] 100 % (04/09 0535) Last BM Date: 01/10/13  Intake/Output from previous day: 04/08 0701 - 04/09 0700 In: 1630 [P.O.:830; I.V.:800] Out: 900 [Urine:900] Intake/Output this shift:    PE: Did not examine  Lab Results:   Recent Labs  01/09/13 0340  01/10/13 0226 01/11/13 0507  WBC 11.6*  --   --  5.5  HGB 8.7*  < > 9.2* 9.4*  HCT 26.5*  < > 26.5* 27.4*  PLT 251  --   --  157  < > = values in this interval not displayed. BMET  Recent Labs  01/09/13 0640 01/11/13 0507  NA 135 143  K 3.3* 3.1*  CL 97 109  CO2 24 24  GLUCOSE 253* 118*  BUN 42* 7  CREATININE 0.70 0.63  CALCIUM 8.9 8.5   PT/INR  Recent Labs  01/09/13 0340  LABPROT 13.6  INR 1.05   CMP     Component Value Date/Time   NA 143 01/11/2013 0507   K 3.1* 01/11/2013 0507   CL 109 01/11/2013 0507   CO2 24 01/11/2013 0507   GLUCOSE 118* 01/11/2013 0507   BUN 7 01/11/2013 0507   CREATININE 0.63 01/11/2013 0507   CALCIUM 8.5 01/11/2013 0507   PROT 6.1 01/09/2013 0640   ALBUMIN 3.4* 01/09/2013 0640   AST 18 01/09/2013 0640   ALT 12 01/09/2013 0640   ALKPHOS 45 01/09/2013 0640   BILITOT 0.3 01/09/2013 0640   GFRNONAA 85* 01/11/2013 0507   GFRAA >90 01/11/2013 0507   Lipase     Component Value Date/Time   LIPASE 22  01/09/2013 0640       Studies/Results: Ct Chest W Contrast  01/10/2013  **ADDENDUM** CREATED: 01/10/2013 16:20:52  These results were called by telephone on 01/10/2013 at 1620 hours to Dr. Rob Bunting, who verbally acknowledged these results.  **END ADDENDUM** SIGNED BY: Charline Bills, M.D.   01/10/2013  *RADIOLOGY REPORT*  Clinical Data:  Gastric mass, abnormal chest radiograph  CT CHEST, ABDOMEN AND PELVIS WITH CONTRAST  Technique:  Multidetector CT imaging of the chest, abdomen and pelvis was performed following the standard protocol during bolus administration of intravenous contrast.  Contrast: OMNIPAQUE IOHEXOL 300 MG/ML  SOLN  Comparison:  CT chest abdomen pelvis dated 08/29/2010.  Chest radiographs dated 01/09/2013.  CT CHEST  Findings:  Numerous bilateral pulmonary nodules, suspicious for metastases, including: 8 x 6 mm right upper lobe pulmonary nodule (series 4/image 10) 8 x 4 mm left upper lobe pulmonary nodule (series 4/image 22) 6 x 8 mm right lower lobe pulmonary nodule (series 4/image 30) 8 x 9 mm left lower lobe  pulmonary nodule (series 4/image 38)  A few of these lesions are cavitary, for example in the bilateral upper lobes (series 4/images 11 and 15).  No pleural effusion or pneumothorax.  Visualized thyroid is unremarkable.  The heart is top normal in size.  No pericardial effusion. Coronary atherosclerosis.  Atherosclerotic calcifications of the aortic arch.  Thoracic lymphadenopathy, including: --9 mm short-axis superior mediastinal node (series 2/image 13) --14 mm short-axis AP window node (series 2/image 18) --10 mm short-axis precarinal node (series 2/image 20) --12 mm short-axis left hilar node (series 2/image 22) --9 mm short-axis subcarinal node (series 2/image 25)  Intramuscular lipoma in the left back (series 2/image 11).  Degenerative changes of the thoracic spine.  IMPRESSION: Numerous bilateral pulmonary nodules/metastases, measuring up to 9 mm, as described above.   Associated mild thoracic nodal metastases, as described above.  CT ABDOMEN AND PELVIS  Findings:  5.5 x 4.3 cm submucosal gastric mass along the lesser curvature (series 2/image 48), previously 4.9 x 3.3 cm in 2011.  7 mm probable cyst in the hepatic dome (series 2/image 41), unchanged. 11 x 7 mm hypoenhancing lesion in the lateral segment left hepatic lobe (series 2/image 49), new, suspicious.  Spleen, pancreas, and adrenal glands are within normal limits.  Gallbladder is unremarkable.  No intrahepatic or extrahepatic ductal dilatation.  Kidneys are within normal limits.  No hydronephrosis.  No evidence of bowel obstruction.  Normal appendix.  Eccentric mass near the rectosigmoid junction (series 2/image 101), compatible with rectal adenocarcinoma.  Associated presacral stranding/soft tissue (series 2/image 103). Adjacent perirectal lymph nodes measuring up to 10 mm short axis (series 2/images 96 and 102).  Additional retroperitoneal/pelvic lymphadenopathy, including: --14 mm short-axis left para-aortic node (series 2/image 64) --11 mm short-axis left common iliac node (series 2/image 82) --13 mm short axis left common iliac node (series 2/image 91) --9 mm short-axis left external iliac node (series 2/image 97)  Atherosclerotic calcifications of the abdominal aorta and branch vessels.  No abdominopelvic ascites.  Status post hysterectomy.  Bilateral ovaries are unremarkable.  Bladder is within normal limits.  Degenerative changes of the lumbar spine.  IMPRESSION: Eccentric mass near the rectosigmoid junction, compatible with primary rectal adenocarcinoma.  Associated retroperitoneal/pelvic nodal metastases, as described above.  11 mm hypoenhancing lesion in the lateral segment left hepatic lobe, suspicious for metastasis.  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  Original Report Authenticated By: Charline Bills, M.D.    Ct  Abdomen Pelvis W Contrast  01/10/2013  **ADDENDUM** CREATED: 01/10/2013 16:20:52  These results were called by telephone on 01/10/2013 at 1620 hours to Dr. Rob Bunting, who verbally acknowledged these results.  **END ADDENDUM** SIGNED BY: Charline Bills, M.D.   01/10/2013  *RADIOLOGY REPORT*  Clinical Data:  Gastric mass, abnormal chest radiograph  CT CHEST, ABDOMEN AND PELVIS WITH CONTRAST  Technique:  Multidetector CT imaging of the chest, abdomen and pelvis was performed following the standard protocol during bolus administration of intravenous contrast.  Contrast: OMNIPAQUE IOHEXOL 300 MG/ML  SOLN  Comparison:  CT chest abdomen pelvis dated 08/29/2010.  Chest radiographs dated 01/09/2013.  CT CHEST  Findings:  Numerous bilateral pulmonary nodules, suspicious for metastases, including: 8 x 6 mm right upper lobe pulmonary nodule (series 4/image 10) 8 x 4 mm left upper lobe pulmonary nodule (series 4/image 22) 6 x 8 mm right lower lobe pulmonary nodule (series 4/image 30) 8 x 9 mm left lower  lobe pulmonary nodule (series 4/image 38)  A few of these lesions are cavitary, for example in the bilateral upper lobes (series 4/images 11 and 15).  No pleural effusion or pneumothorax.  Visualized thyroid is unremarkable.  The heart is top normal in size.  No pericardial effusion. Coronary atherosclerosis.  Atherosclerotic calcifications of the aortic arch.  Thoracic lymphadenopathy, including: --9 mm short-axis superior mediastinal node (series 2/image 13) --14 mm short-axis AP window node (series 2/image 18) --10 mm short-axis precarinal node (series 2/image 20) --12 mm short-axis left hilar node (series 2/image 22) --9 mm short-axis subcarinal node (series 2/image 25)  Intramuscular lipoma in the left back (series 2/image 11).  Degenerative changes of the thoracic spine.  IMPRESSION: Numerous bilateral pulmonary nodules/metastases, measuring up to 9 mm, as described above.  Associated mild thoracic nodal  metastases, as described above.  CT ABDOMEN AND PELVIS  Findings:  5.5 x 4.3 cm submucosal gastric mass along the lesser curvature (series 2/image 48), previously 4.9 x 3.3 cm in 2011.  7 mm probable cyst in the hepatic dome (series 2/image 41), unchanged. 11 x 7 mm hypoenhancing lesion in the lateral segment left hepatic lobe (series 2/image 49), new, suspicious.  Spleen, pancreas, and adrenal glands are within normal limits.  Gallbladder is unremarkable.  No intrahepatic or extrahepatic ductal dilatation.  Kidneys are within normal limits.  No hydronephrosis.  No evidence of bowel obstruction.  Normal appendix.  Eccentric mass near the rectosigmoid junction (series 2/image 101), compatible with rectal adenocarcinoma.  Associated presacral stranding/soft tissue (series 2/image 103). Adjacent perirectal lymph nodes measuring up to 10 mm short axis (series 2/images 96 and 102).  Additional retroperitoneal/pelvic lymphadenopathy, including: --14 mm short-axis left para-aortic node (series 2/image 64) --11 mm short-axis left common iliac node (series 2/image 82) --13 mm short axis left common iliac node (series 2/image 91) --9 mm short-axis left external iliac node (series 2/image 97)  Atherosclerotic calcifications of the abdominal aorta and branch vessels.  No abdominopelvic ascites.  Status post hysterectomy.  Bilateral ovaries are unremarkable.  Bladder is within normal limits.  Degenerative changes of the lumbar spine.  IMPRESSION: Eccentric mass near the rectosigmoid junction, compatible with primary rectal adenocarcinoma.  Associated retroperitoneal/pelvic nodal metastases, as described above.  11 mm hypoenhancing lesion in the lateral segment left hepatic lobe, suspicious for metastasis.  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  Original Report Authenticated By: Charline Bills, M.D.     Anti-infectives: Anti-infectives    None       Assessment/Plan 1. GI bleed with probable gist tumor  2. Prior history of mass with biopsy 09/02/2010; resection was declined at that time.  Pt does not wish for surgery to follow her at this time.  She has asked that we do not see her anymore.  We will sign off for now but are certainly available if needed.   LOS: 2 days    WHITE, ELIZABETH 01/11/2013

## 2013-01-11 NOTE — Progress Notes (Signed)
Nutrition Brief Note  Patient identified on the Malnutrition Screening Tool (MST) Report  Body mass index is 29.18 kg/(m^2). Patient meets criteria for Overweight based on current BMI. Pt reports that she usually weighs between 164 and 169 lbs- not wt loss.  Current diet order is Heart healthy, patient is consuming approximately 100% of meals at this time. Pt reports that she has a good appetite and was eating well PTA with 3 good meals daily. Pt eats a healthy balanced diet with fruit, vegetables and protein. Encouraged continuation of adequate calorie and protein intake throughout treatment. Labs and medications reviewed.   No further nutrition interventions warranted at this time. If nutrition issues arise, please consult RD.   Ian Malkin RD, LDN Inpatient Clinical Dietitian Pager: [phone] After Hours Pager: [phone]

## 2013-01-11 NOTE — Telephone Encounter (Signed)
Message copied by Donata Duff on Wed Jan 11, 2013  9:40 AM ------      Message from: Rob Bunting P      Created: Wed Jan 11, 2013  9:30 AM       Beau Ramsburg,            She is being discharged today from [facility].  She needs new patient appt with medical and surgical oncology for newly diagnosed metastatic rectal cancer (near obstructing).  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            [name],       Can you help expedite those appts.  Also she needs to be on for GI tumor board.  I am out of town next week, so would be good for 2 weeks from today.  (I am covering hosps this week, very busy so couldn't make tumor board this AM).            Thanks             ------

## 2013-01-11 NOTE — Telephone Encounter (Signed)
Message copied by Donata Duff on Wed Jan 11, 2013 11:39 AM ------      Message from: Rob Bunting P      Created: Wed Jan 11, 2013 11:24 AM       Katie [name]            I mis-typed.  She does not need to see surgeon.  She does need to see radiation oncology.  Sorry, sorry.                  ----- Message -----         From: Katie Rue, RN         Sent: 01/11/2013  11:13 AM           To: Rachael Fee, MD            She has an appointment with Dr. Romie Levee on 01/17/13 at 0910.  I am working on something for her with Dr. Truett Perna. I will call the patient today.      ----- Message -----         From: Rachael Fee, MD         Sent: 01/11/2013   9:30 AM           To: Katie Rue, RN, Tru Leopard Luretha Rued, CMA            [name],            She is being discharged today from [facility].  She needs new patient appt with medical and surgical oncology for newly diagnosed metastatic rectal cancer (near obstructing).  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            [name],       Can you help expedite those appts.  Also she needs to be on for GI tumor board.  I am out of town next week, so would be good for 2 weeks from today.  (I am covering hosps this week, very busy so couldn't make tumor board this AM).            Thanks                         ------

## 2013-01-11 NOTE — Telephone Encounter (Signed)
Referral has been put into EPIC  Gina let me know if I need to do anything else.  Thanks

## 2013-01-12 ENCOUNTER — Encounter (HOSPITAL_COMMUNITY): Payer: Self-pay | Admitting: Gastroenterology

## 2013-01-12 DIAGNOSIS — D72819 Decreased white blood cell count, unspecified: Secondary | ICD-10-CM

## 2013-01-12 LAB — CBC
HCT: 26.5 % — ABNORMAL LOW (ref 36.0–46.0)
MCV: 85.5 fL (ref 78.0–100.0)
Platelets: 172 10*3/uL (ref 150–400)
RBC: 3.1 MIL/uL — ABNORMAL LOW (ref 3.87–5.11)
WBC: 5.8 10*3/uL (ref 4.0–10.5)

## 2013-01-12 NOTE — Discharge Summary (Signed)
Physician Discharge Summary  Katie [name] ZOX:[id number] DOB: [birth year] DOA: 01/09/2013  PCP: Avon Gully, MD  Admit date: 01/09/2013 Discharge date: 01/12/2013  Time spent: 40 minutes  Recommendations for Outpatient Follow-up:  1. Monitor CBC as an outpatient either in PCP office or when evaluated by oncology   Discharge Diagnoses:  Active Problems:   Black tarry stools   GI bleed   Anemia   Leukocytopenia, unspecified   Hypokalemia   Solitary pulmonary nodule   Rectal cancer  Discharge Condition: Stable  Diet recommendation: regular  Filed Weights   01/09/13 1000  Weight: 74.7 kg (164 lb 10.9 oz)    History of present illness:  HPI: Katie [name] is a 79 y.o. female with prior h/o mass in her stomach , hypertension, GERD, hyperlipidemia, came in to ED yesterday for melanotic stools , she underwent basic lab work and was discharged. She presented today at [facility] with recurrent melanotic stools and dizziness and was found to have a drop of 3 gm of hemoglobin. She was referred to [facility] for admission tomedical service and GI for consultation. On arrival she denies any abd pain, slight nausea . She denies hematemesis. She denies NSAID use and hematochezia.   Hospital Course:  Melanotic stools: probably upper GI bleed from the stomach mass diag 3 years ago. GI has been consulted and patient underwent an EGD 4/18 (report below) and biopsies were taken. General surgery consulted for possible resection of the tumour. As per Dr Christella Hartigan she had this tumour for 3 years and has refused resection in the past. Patient expressed that she is upset that surgery has been consulted without her consent and expressed her wishes to have the surgery outside [place], however later changed her mind and if she needs surgery for her GIST she would be willing to have that here. She also underwent a colonoscopy which showed probable cancer and given radiologic findings of metastasis in liver, lungs and extensive  lymphadenopathy this is likely stage IV rectal cancer. With these findings, she is a poor operative candidate and will now follow up with radiation oncology in 4 days to begin XRT. Appreciate Dr. Christella Hartigan help for follow up. Following her EGD and CSY her hemoglobin has remained stable and was 8.9 on discharge.   Procedures:  EGD ENDOSCOPIC IMPRESSION:  There was a round subepithelial mass in proximal stomach, about 1-2  cm distal to the GE junction. 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 The  mucosa overlying the mass was hyperemic, inflamed but not  neoplastic appearing. The mass was about 4cm diameter (a bit  larger than 3 years ago). Tunnel biopsies were performed and the  mass oozed more than usual blood from the biopsy site. The site  was therefore closed with placement of a single endoclip. The UGI  tract was otherwise normal.  Colonoscopy ENDOSCOPIC IMPRESSION:  There was a 5cm long, circumferential, near obstructing mass in  proximal rectum with distal edge 8-9cm from anal verge. The mass  was ulcerated, fungating and it was biopsied. There was a broad  based 3cm polyp in sigmoid that was slightly firm, perhaps houses  canncer as well. I elected not to biopsy or remove this lesion.  There was diverticulosis throughout the colon. The examination was  otherwise normal.  Consultations:  GI  Surgery  Discharge Exam: Filed Vitals:   01/11/13 1000 01/11/13 1333 01/12/13 0006 01/12/13 0538  BP: 137/77 114/59 130/61 142/69  Pulse:  69 75 69  Temp:  98.7 F (37.1  C) 98.3 F (36.8 C) 98.3 F (36.8 C)  TempSrc:  Oral Oral Oral  Resp: 14 16 20 16   Height:      Weight:      SpO2: 99% 100% 98% 100%    General: NAD Cardiovascular: RRR Respiratory: CTA biL  Discharge Instructions   Future Appointments Provider Department Dept Phone   01/16/2013 1:30 PM [facility] Nurse [facility] [phone]   01/16/2013 2:00 PM Jonna Coup, MD [facility] [phone]   01/30/2013 11:00 AM Rachael Fee, MD [facility] [phone]       Medication List    TAKE these medications       ALPRAZolam 0.5 MG tablet  Commonly known as:  XANAX  Take 0.5 mg by mouth at bedtime as needed for sleep.     benazepril-hydrochlorthiazide 20-25 MG per tablet  Commonly known as:  LOTENSIN HCT  Take 1 tablet by mouth 2 (two) times daily.     ferrous sulfate 325 (65 FE) MG tablet  Take 1 tablet (325 mg total) by mouth 3 (three) times daily with meals.     latanoprost 0.005 % ophthalmic solution  Commonly known as:  XALATAN  1 drop at bedtime.     verapamil 120 MG CR tablet  Commonly known as:  CALAN-SR  Take 120 mg by mouth 2 (two) times daily.        The results of significant diagnostics from this hospitalization (including imaging, microbiology, ancillary and laboratory) are listed below for reference.    Significant Diagnostic Studies: Ct Chest W Contrast  01/10/2013  **ADDENDUM** CREATED: 01/10/2013 16:20:52  These results were called by telephone on 01/10/2013 at 1620 hours to Dr. Rob Bunting, who verbally acknowledged these results.  **END ADDENDUM** SIGNED BY: Charline Bills, M.D.   01/10/2013  *RADIOLOGY REPORT*  Clinical Data:  Gastric mass, abnormal chest radiograph  CT CHEST, ABDOMEN AND PELVIS WITH CONTRAST  Technique:  Multidetector CT imaging of the chest, abdomen and pelvis was performed following the standard protocol during bolus administration of intravenous contrast.  Contrast: OMNIPAQUE IOHEXOL 300 MG/ML  SOLN  Comparison:  CT chest abdomen pelvis dated 08/29/2010.  Chest radiographs dated 01/09/2013.  CT CHEST  Findings:  Numerous bilateral pulmonary nodules, suspicious for metastases, including: 8 x 6 mm right upper lobe pulmonary nodule (series 4/image 10) 8 x 4 mm left upper lobe pulmonary nodule (series 4/image 22) 6 x 8 mm right lower lobe pulmonary nodule (series  4/image 30) 8 x 9 mm left lower lobe pulmonary nodule (series 4/image 38)  A few of these lesions are cavitary, for example in the bilateral upper lobes (series 4/images 11 and 15).  No pleural effusion or pneumothorax.  Visualized thyroid is unremarkable.  The heart is top normal in size.  No pericardial effusion. Coronary atherosclerosis.  Atherosclerotic calcifications of the aortic arch.  Thoracic lymphadenopathy, including: --9 mm short-axis superior mediastinal node (series 2/image 13) --14 mm short-axis AP window node (series 2/image 18) --10 mm short-axis precarinal node (series 2/image 20) --12 mm short-axis left hilar node (series 2/image 22) --9 mm short-axis subcarinal node (series 2/image 25)  Intramuscular lipoma in the left back (series 2/image 11).  Degenerative changes of the thoracic spine.  IMPRESSION: Numerous bilateral pulmonary nodules/metastases, measuring up to 9 mm, as described above.  Associated mild thoracic nodal metastases, as described above.  CT ABDOMEN AND PELVIS  Findings:  5.5 x 4.3 cm  submucosal gastric mass along the lesser curvature (series 2/image 48), previously 4.9 x 3.3 cm in 2011.  7 mm probable cyst in the hepatic dome (series 2/image 41), unchanged. 11 x 7 mm hypoenhancing lesion in the lateral segment left hepatic lobe (series 2/image 49), new, suspicious.  Spleen, pancreas, and adrenal glands are within normal limits.  Gallbladder is unremarkable.  No intrahepatic or extrahepatic ductal dilatation.  Kidneys are within normal limits.  No hydronephrosis.  No evidence of bowel obstruction.  Normal appendix.  Eccentric mass near the rectosigmoid junction (series 2/image 101), compatible with rectal adenocarcinoma.  Associated presacral stranding/soft tissue (series 2/image 103). Adjacent perirectal lymph nodes measuring up to 10 mm short axis (series 2/images 96 and 102).  Additional retroperitoneal/pelvic lymphadenopathy, including: --14 mm short-axis left para-aortic node  (series 2/image 64) --11 mm short-axis left common iliac node (series 2/image 82) --13 mm short axis left common iliac node (series 2/image 91) --9 mm short-axis left external iliac node (series 2/image 97)  Atherosclerotic calcifications of the abdominal aorta and branch vessels.  No abdominopelvic ascites.  Status post hysterectomy.  Bilateral ovaries are unremarkable.  Bladder is within normal limits.  Degenerative changes of the lumbar spine.  IMPRESSION: Eccentric mass near the rectosigmoid junction, compatible with primary rectal adenocarcinoma.  Associated retroperitoneal/pelvic nodal metastases, as described above.  11 mm hypoenhancing lesion in the lateral segment left hepatic lobe, suspicious for metastasis.  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  Original Report Authenticated By: Charline Bills, M.D.    Ct Abdomen Pelvis W Contrast  01/10/2013  **ADDENDUM** CREATED: 01/10/2013 16:20:52  These results were called by telephone on 01/10/2013 at 1620 hours to Dr. Rob Bunting, who verbally acknowledged these results.  **END ADDENDUM** SIGNED BY: Charline Bills, M.D.   01/10/2013  *RADIOLOGY REPORT*  Clinical Data:  Gastric mass, abnormal chest radiograph  CT CHEST, ABDOMEN AND PELVIS WITH CONTRAST  Technique:  Multidetector CT imaging of the chest, abdomen and pelvis was performed following the standard protocol during bolus administration of intravenous contrast.  Contrast: OMNIPAQUE IOHEXOL 300 MG/ML  SOLN  Comparison:  CT chest abdomen pelvis dated 08/29/2010.  Chest radiographs dated 01/09/2013.  CT CHEST  Findings:  Numerous bilateral pulmonary nodules, suspicious for metastases, including: 8 x 6 mm right upper lobe pulmonary nodule (series 4/image 10) 8 x 4 mm left upper lobe pulmonary nodule (series 4/image 22) 6 x 8 mm right lower lobe pulmonary nodule (series 4/image 30) 8 x 9 mm left lower lobe  pulmonary nodule (series 4/image 38)  A few of these lesions are cavitary, for example in the bilateral upper lobes (series 4/images 11 and 15).  No pleural effusion or pneumothorax.  Visualized thyroid is unremarkable.  The heart is top normal in size.  No pericardial effusion. Coronary atherosclerosis.  Atherosclerotic calcifications of the aortic arch.  Thoracic lymphadenopathy, including: --9 mm short-axis superior mediastinal node (series 2/image 13) --14 mm short-axis AP window node (series 2/image 18) --10 mm short-axis precarinal node (series 2/image 20) --12 mm short-axis left hilar node (series 2/image 22) --9 mm short-axis subcarinal node (series 2/image 25)  Intramuscular lipoma in the left back (series 2/image 11).  Degenerative changes of the thoracic spine.  IMPRESSION: Numerous bilateral pulmonary nodules/metastases, measuring up to 9 mm, as described above.  Associated mild thoracic nodal metastases, as described above.  CT ABDOMEN AND PELVIS  Findings:  5.5 x 4.3  cm submucosal gastric mass along the lesser curvature (series 2/image 48), previously 4.9 x 3.3 cm in 2011.  7 mm probable cyst in the hepatic dome (series 2/image 41), unchanged. 11 x 7 mm hypoenhancing lesion in the lateral segment left hepatic lobe (series 2/image 49), new, suspicious.  Spleen, pancreas, and adrenal glands are within normal limits.  Gallbladder is unremarkable.  No intrahepatic or extrahepatic ductal dilatation.  Kidneys are within normal limits.  No hydronephrosis.  No evidence of bowel obstruction.  Normal appendix.  Eccentric mass near the rectosigmoid junction (series 2/image 101), compatible with rectal adenocarcinoma.  Associated presacral stranding/soft tissue (series 2/image 103). Adjacent perirectal lymph nodes measuring up to 10 mm short axis (series 2/images 96 and 102).  Additional retroperitoneal/pelvic lymphadenopathy, including: --14 mm short-axis left para-aortic node (series 2/image 64) --11 mm  short-axis left common iliac node (series 2/image 82) --13 mm short axis left common iliac node (series 2/image 91) --9 mm short-axis left external iliac node (series 2/image 97)  Atherosclerotic calcifications of the abdominal aorta and branch vessels.  No abdominopelvic ascites.  Status post hysterectomy.  Bilateral ovaries are unremarkable.  Bladder is within normal limits.  Degenerative changes of the lumbar spine.  IMPRESSION: Eccentric mass near the rectosigmoid junction, compatible with primary rectal adenocarcinoma.  Associated retroperitoneal/pelvic nodal metastases, as described above.  11 mm hypoenhancing lesion in the lateral segment left hepatic lobe, suspicious for metastasis.  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  Original Report Authenticated By: Charline Bills, M.D.    Dg Chest Port 1 View  01/09/2013  *RADIOLOGY REPORT*  Clinical Data: Abdominal pain with gaseous distension.  History of hypertension and gastroesophageal reflux.  PORTABLE CHEST - 1 VIEW  Comparison: 02/21/2008 radiographs; CT 08/29/2010.  Findings: 0700 hours.  The heart size and mediastinal contours are stable.  There is a small nodular density superimposed over the posterior aspect of the right ninth rib.  There is no corresponding finding on the prior studies.  The lungs are otherwise clear and there is no pleural effusion or pneumothorax.  Telemetry leads overlie the chest.  IMPRESSION:  1.  No acute cardiopulmonary process identified. 2.  Cannot exclude right basilar pulmonary nodule.  This could be due to an overlap of vascular and osseous structures. Unless CT is indicated for other reasons, I would suggest initial assessment with a repeat PA view.   Original Report Authenticated By: Carey Bullocks, M.D.     Microbiology: Recent Results (from the past 240 hour(s))  MRSA PCR SCREENING     Status: None   Collection Time     01/09/13 10:14 AM      Result Value Range Status   MRSA by PCR NEGATIVE  NEGATIVE Final   Comment:            The GeneXpert MRSA Assay (FDA     approved for NASAL specimens     only), is one component of a     comprehensive MRSA colonization     surveillance program. It is not     intended to diagnose MRSA     infection nor to guide or     monitor treatment for     MRSA infections.     Labs: Basic Metabolic Panel:  Recent Labs Lab 01/08/13 1200 01/09/13 0640 01/11/13 0507  NA 133* 135 143  K 3.4* 3.3* 3.1*  CL 96 97 109  CO2 25  24 24  GLUCOSE 138* 253* 118*  BUN 29* 42* 7  CREATININE 0.60 0.70 0.63  CALCIUM 9.2 8.9 8.5   Liver Function Tests:  Recent Labs Lab 01/08/13 1200 01/09/13 0640  AST 20 18  ALT 12 12  ALKPHOS 55 45  BILITOT 0.4 0.3  PROT 7.0 6.1  ALBUMIN 3.8 3.4*    Recent Labs Lab 01/08/13 1200 01/09/13 0640  LIPASE 32 22   CBC:  Recent Labs Lab 01/08/13 1200 01/09/13 0340 01/09/13 1104 01/10/13 0226 01/11/13 0507 01/11/13 1435 01/12/13 0445  WBC 9.6 11.6*  --   --  5.5 5.7 5.8  NEUTROABS 7.7 8.2*  --   --   --   --   --   HGB 11.5* 8.7* 7.5* 9.2* 9.4* 8.9* 8.9*  HCT 34.0* 26.5* 22.0* 26.5* 27.4* 25.9* 26.5*  MCV 85.4 86.9  --   --  84.3 84.6 85.5  PLT 231 251  --   --  157 159 172   Cardiac Enzymes:  Recent Labs Lab 01/09/13 0640  TROPONINI <0.30    Signed:  Pamella Pert  [facility] 01/12/2013, 9:26 AM

## 2013-01-16 ENCOUNTER — Ambulatory Visit
Admit: 2013-01-16 | Discharge: 2013-01-16 | Disposition: A | Discharge status: Home / Self Care | Payer: Medicare Other | Attending: Radiation Oncology | Admitting: Radiation Oncology

## 2013-01-16 ENCOUNTER — Encounter: Payer: Self-pay | Admitting: Radiation Oncology

## 2013-01-16 VITALS — BP 130/68 | HR 88 | Temp 98.1°F | Resp 20 | Ht 63.0 in | Wt 170.8 lb

## 2013-01-16 DIAGNOSIS — Z79899 Other long term (current) drug therapy: Secondary | ICD-10-CM | POA: Insufficient documentation

## 2013-01-16 DIAGNOSIS — K219 Gastro-esophageal reflux disease without esophagitis: Secondary | ICD-10-CM | POA: Insufficient documentation

## 2013-01-16 DIAGNOSIS — H409 Unspecified glaucoma: Secondary | ICD-10-CM | POA: Insufficient documentation

## 2013-01-16 DIAGNOSIS — C775 Secondary and unspecified malignant neoplasm of intrapelvic lymph nodes: Secondary | ICD-10-CM | POA: Insufficient documentation

## 2013-01-16 DIAGNOSIS — C20 Malignant neoplasm of rectum: Secondary | ICD-10-CM

## 2013-01-16 DIAGNOSIS — R918 Other nonspecific abnormal finding of lung field: Secondary | ICD-10-CM | POA: Insufficient documentation

## 2013-01-16 DIAGNOSIS — E785 Hyperlipidemia, unspecified: Secondary | ICD-10-CM | POA: Insufficient documentation

## 2013-01-16 DIAGNOSIS — C772 Secondary and unspecified malignant neoplasm of intra-abdominal lymph nodes: Secondary | ICD-10-CM | POA: Insufficient documentation

## 2013-01-16 DIAGNOSIS — K319 Disease of stomach and duodenum, unspecified: Secondary | ICD-10-CM | POA: Insufficient documentation

## 2013-01-16 DIAGNOSIS — I10 Essential (primary) hypertension: Secondary | ICD-10-CM | POA: Insufficient documentation

## 2013-01-16 DIAGNOSIS — C801 Malignant (primary) neoplasm, unspecified: Secondary | ICD-10-CM | POA: Insufficient documentation

## 2013-01-16 HISTORY — DX: Malignant (primary) neoplasm, unspecified: C80.1

## 2013-01-16 MED ORDER — ALPRAZOLAM 0.5 MG PO TABS: 0.5000 mg | ORAL_TABLET | Freq: Every evening | ORAL | Status: DC | PRN

## 2013-01-16 NOTE — Progress Notes (Signed)
Single, retired high school Sport and exercise psychologist. She has a son and adopted her grandson at age 79; he is now 60.  Pt has hx of GI bleeds from "stomach tumor at my GE junction". She states she has been under surveillance in this area. 01/10/13 biopsy of this area benign. She states she "became weak, fainted and presented at [facility] ED where a ct scan revealed a rectal tumor". Pt states "I feel the same as I did before this ever happened." She c/o "nagging ache in her right groin", denies bright rectal bleeding, states she is taking iron so difficult to tell if stool is dark from blood or iron. She denies fatigue, loss of appetite.

## 2013-01-16 NOTE — Progress Notes (Signed)
Please see the Nurse Progress Note in the MD Initial Consult Encounter for this patient. 

## 2013-01-17 ENCOUNTER — Ambulatory Visit (INDEPENDENT_AMBULATORY_CARE_PROVIDER_SITE_OTHER): Payer: Medicare Other | Admitting: General Surgery

## 2013-01-17 NOTE — Progress Notes (Addendum)
Radiation Oncology         [phone] ________________________________  Name: Katie [name] MRN: [id number]  Date: 01/16/2013  DOB: 01/04/1934  WU:JWJXB,JYNWGNF, MD  Rachael Fee, MD   G. Rolm Baptise, M.D.  REFERRING PHYSICIAN: Rachael Fee, MD   DIAGNOSIS: The encounter diagnosis was Rectal cancer.   HISTORY OF PRESENT ILLNESS::Katie [name] is a 79 y.o. female who is seen for an initial consultation visit. The patient was recently seen after having melenic stools and she proceeded with further workup for a possible GI bleed. The patient notably in 2010 was worked up for a GI bleed at that time and was found to have a nodule in the proximal stomach near the GE junction. Resection of this tumor it was recommended although biopsies did not reveal clear malignancy. The patient however did not decide to proceed with this surgery.  The patient therefore underwent further workup for possible GI bleed recently. An EGD was once again performed and this showed a subepithelial mass in the proximal stomach about 1-2 cm distal to the GE junction. 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 The mass was about 4 cm in diameter, slightly larger than previously noted. The biopsies did not show any evidence of malignancy at this showed mild chronic inflammation.   The patient also underwent a colonoscopy. A circumferential near obstructing mass was found in the proximal rectum measuring 5 cm in length. The distal age was 8-9 cm from the anal verge. This was also ulcerated and fungating and this was biopsied. This revealed invasive adenocarcinoma.  The patient has undergone a CT scan of the chest abdomen and pelvis. Numerous bilateral pulmonary nodules were found suspicious for metastases within the chest. An asymmetric mass was present near the rectosigmoid junction compatible with a primary rectal adenocarcinoma. Retroperitoneal and pelvic nodal metastases were seen in addition to an  11 mm hypoenhancing lesion in the left hepatic lobe which was also suspicious for metastasis. The patient therefore has findings consistent with stage IV rectal cancer.  The patient denies any known history of other GI jet symptoms other than that above. Possibly slightly smaller caliber of stools. She denies any pelvic pain or any significant constipation. She has had some right inguinal pain on occasion but this has not been persistent. She denies any shortness of breath. She denies any headache, or nausea.     PREVIOUS RADIATION THERAPY: No   PAST MEDICAL HISTORY:  has a past medical history of Glaucoma(365); Hypertension; Anxiety; GERD (gastroesophageal reflux disease); Hemorrhoids; Hyperlipidemia; and Cancer (01/11/13).     PAST SURGICAL HISTORY: Past Surgical History  Procedure Laterality Date  . Esophagogastroduodenoscopy N/A 01/10/2013    Procedure: ESOPHAGOGASTRODUODENOSCOPY (EGD);  Surgeon: Rachael Fee, MD;  Location: [facility];  Service: Endoscopy;  Laterality: N/A;  . Colonoscopy N/A 01/11/2013    Procedure: COLONOSCOPY;  Surgeon: Rachael Fee, MD;  Location: [facility];  Service: Endoscopy;  Laterality: N/A;  . Biopsy stomach  01/10/13    mild chronic inflammation, no evidence of malignancy, dysplasia  . Biopsy rectal  01/11/13    proximal mass-invasive adenocarcinoma  . Abdominal hysterectomy      many yrs ago, partial     FAMILY HISTORY: family history includes Cancer in her brother and sister; Hyperlipidemia in her sister; Hypertension in her father and sister; and Stroke in her mother.   SOCIAL HISTORY:  reports that she has never smoked. She has never used smokeless tobacco. She reports that  she does not drink alcohol or use illicit drugs.   ALLERGIES: Review of patient's allergies indicates no known allergies.   MEDICATIONS:  Current Outpatient Prescriptions  Medication Sig Dispense Refill  . ALPRAZolam (XANAX) 0.5 MG tablet Take 1 tablet (0.5 mg total)  by mouth at bedtime as needed for sleep.  30 tablet  0  . benazepril-hydrochlorthiazide (LOTENSIN HCT) 20-25 MG per tablet Take 1 tablet by mouth 2 (two) times daily.       . ferrous sulfate 325 (65 FE) MG tablet Take 1 tablet (325 mg total) by mouth 3 (three) times daily with meals.  90 tablet  1  . latanoprost (XALATAN) 0.005 % ophthalmic solution 1 drop at bedtime.      . verapamil (CALAN-SR) 120 MG CR tablet Take 120 mg by mouth 2 (two) times daily.       No current facility-administered medications for this encounter.     REVIEW OF SYSTEMS:  A 15 point review of systems is documented in the electronic medical record. This was obtained by the nursing staff. However, I reviewed this with the patient to discuss relevant findings and make appropriate changes.  Pertinent items are noted in HPI.    PHYSICAL EXAM:  height is 5\' 3"  (1.6 m) and weight is 170 lb 12.8 oz (77.474 kg). Her oral temperature is 98.1 F (36.7 C). Her blood pressure is 130/68 and her pulse is 88. Her respiration is 20.   General: Well-developed, in no acute distress HEENT: Normocephalic, atraumatic; oral cavity clear Neck: Supple without any lymphadenopathy Cardiovascular: Regular rate and rhythm Respiratory: Clear to auscultation bilaterally GI: Soft, nontender, normal bowel sounds Extremities: No edema present Neuro: No focal deficits Lymphatics: No inguinal adenopathy present Rectal: No significant external or internal findings, I could not palpate the distal age of the tumor. No blood on exam glove.   LABORATORY DATA:  Lab Results  Component Value Date   WBC 5.8 01/12/2013   HGB 8.9* 01/12/2013   HCT 26.5* 01/12/2013   MCV 85.5 01/12/2013   PLT 172 01/12/2013   Lab Results  Component Value Date   NA 143 01/11/2013   K 3.1* 01/11/2013   CL 109 01/11/2013   CO2 24 01/11/2013   Lab Results  Component Value Date   ALT 12 01/09/2013   AST 18 01/09/2013   ALKPHOS 45 01/09/2013   BILITOT 0.3 01/09/2013       RADIOGRAPHY: Ct Chest W Contrast  01/10/2013  **ADDENDUM** CREATED: 01/10/2013 16:20:52  These results were called by telephone on 01/10/2013 at 1620 hours to Dr. Rob Bunting, who verbally acknowledged these results.  **END ADDENDUM** SIGNED BY: Charline Bills, M.D.   01/10/2013  *RADIOLOGY REPORT*  Clinical Data:  Gastric mass, abnormal chest radiograph  CT CHEST, ABDOMEN AND PELVIS WITH CONTRAST  Technique:  Multidetector CT imaging of the chest, abdomen and pelvis was performed following the standard protocol during bolus administration of intravenous contrast.  Contrast: OMNIPAQUE IOHEXOL 300 MG/ML  SOLN  Comparison:  CT chest abdomen pelvis dated 08/29/2010.  Chest radiographs dated 01/09/2013.  CT CHEST  Findings:  Numerous bilateral pulmonary nodules, suspicious for metastases, including: 8 x 6 mm right upper lobe pulmonary nodule (series 4/image 10) 8 x 4 mm left upper lobe pulmonary nodule (series 4/image 22) 6 x 8 mm right lower lobe pulmonary nodule (series 4/image 30) 8 x 9 mm left lower lobe pulmonary nodule (series 4/image 38)  A few of these lesions are cavitary, for  example in the bilateral upper lobes (series 4/images 11 and 15).  No pleural effusion or pneumothorax.  Visualized thyroid is unremarkable.  The heart is top normal in size.  No pericardial effusion. Coronary atherosclerosis.  Atherosclerotic calcifications of the aortic arch.  Thoracic lymphadenopathy, including: --9 mm short-axis superior mediastinal node (series 2/image 13) --14 mm short-axis AP window node (series 2/image 18) --10 mm short-axis precarinal node (series 2/image 20) --12 mm short-axis left hilar node (series 2/image 22) --9 mm short-axis subcarinal node (series 2/image 25)  Intramuscular lipoma in the left back (series 2/image 11).  Degenerative changes of the thoracic spine.  IMPRESSION: Numerous bilateral pulmonary nodules/metastases, measuring up to 9 mm, as described above.  Associated mild thoracic  nodal metastases, as described above.  CT ABDOMEN AND PELVIS  Findings:  5.5 x 4.3 cm submucosal gastric mass along the lesser curvature (series 2/image 48), previously 4.9 x 3.3 cm in 2011.  7 mm probable cyst in the hepatic dome (series 2/image 41), unchanged. 11 x 7 mm hypoenhancing lesion in the lateral segment left hepatic lobe (series 2/image 49), new, suspicious.  Spleen, pancreas, and adrenal glands are within normal limits.  Gallbladder is unremarkable.  No intrahepatic or extrahepatic ductal dilatation.  Kidneys are within normal limits.  No hydronephrosis.  No evidence of bowel obstruction.  Normal appendix.  Eccentric mass near the rectosigmoid junction (series 2/image 101), compatible with rectal adenocarcinoma.  Associated presacral stranding/soft tissue (series 2/image 103). Adjacent perirectal lymph nodes measuring up to 10 mm short axis (series 2/images 96 and 102).  Additional retroperitoneal/pelvic lymphadenopathy, including: --14 mm short-axis left para-aortic node (series 2/image 64) --11 mm short-axis left common iliac node (series 2/image 82) --13 mm short axis left common iliac node (series 2/image 91) --9 mm short-axis left external iliac node (series 2/image 97)  Atherosclerotic calcifications of the abdominal aorta and branch vessels.  No abdominopelvic ascites.  Status post hysterectomy.  Bilateral ovaries are unremarkable.  Bladder is within normal limits.  Degenerative changes of the lumbar spine.  IMPRESSION: Eccentric mass near the rectosigmoid junction, compatible with primary rectal adenocarcinoma.  Associated retroperitoneal/pelvic nodal metastases, as described above.  11 mm hypoenhancing lesion in the lateral segment left hepatic lobe, suspicious for metastasis.  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  Original Report Authenticated By: Charline Bills, M.D.    Ct Abdomen Pelvis W  Contrast  01/10/2013  **ADDENDUM** CREATED: 01/10/2013 16:20:52  These results were called by telephone on 01/10/2013 at 1620 hours to Dr. Rob Bunting, who verbally acknowledged these results.  **END ADDENDUM** SIGNED BY: Charline Bills, M.D.   01/10/2013  *RADIOLOGY REPORT*  Clinical Data:  Gastric mass, abnormal chest radiograph  CT CHEST, ABDOMEN AND PELVIS WITH CONTRAST  Technique:  Multidetector CT imaging of the chest, abdomen and pelvis was performed following the standard protocol during bolus administration of intravenous contrast.  Contrast: OMNIPAQUE IOHEXOL 300 MG/ML  SOLN  Comparison:  CT chest abdomen pelvis dated 08/29/2010.  Chest radiographs dated 01/09/2013.  CT CHEST  Findings:  Numerous bilateral pulmonary nodules, suspicious for metastases, including: 8 x 6 mm right upper lobe pulmonary nodule (series 4/image 10) 8 x 4 mm left upper lobe pulmonary nodule (series 4/image 22) 6 x 8 mm right lower lobe pulmonary nodule (series 4/image 30) 8 x 9 mm left lower lobe pulmonary nodule (series 4/image 38)  A few of these lesions are cavitary,  for example in the bilateral upper lobes (series 4/images 11 and 15).  No pleural effusion or pneumothorax.  Visualized thyroid is unremarkable.  The heart is top normal in size.  No pericardial effusion. Coronary atherosclerosis.  Atherosclerotic calcifications of the aortic arch.  Thoracic lymphadenopathy, including: --9 mm short-axis superior mediastinal node (series 2/image 13) --14 mm short-axis AP window node (series 2/image 18) --10 mm short-axis precarinal node (series 2/image 20) --12 mm short-axis left hilar node (series 2/image 22) --9 mm short-axis subcarinal node (series 2/image 25)  Intramuscular lipoma in the left back (series 2/image 11).  Degenerative changes of the thoracic spine.  IMPRESSION: Numerous bilateral pulmonary nodules/metastases, measuring up to 9 mm, as described above.  Associated mild thoracic nodal metastases, as described  above.  CT ABDOMEN AND PELVIS  Findings:  5.5 x 4.3 cm submucosal gastric mass along the lesser curvature (series 2/image 48), previously 4.9 x 3.3 cm in 2011.  7 mm probable cyst in the hepatic dome (series 2/image 41), unchanged. 11 x 7 mm hypoenhancing lesion in the lateral segment left hepatic lobe (series 2/image 49), new, suspicious.  Spleen, pancreas, and adrenal glands are within normal limits.  Gallbladder is unremarkable.  No intrahepatic or extrahepatic ductal dilatation.  Kidneys are within normal limits.  No hydronephrosis.  No evidence of bowel obstruction.  Normal appendix.  Eccentric mass near the rectosigmoid junction (series 2/image 101), compatible with rectal adenocarcinoma.  Associated presacral stranding/soft tissue (series 2/image 103). Adjacent perirectal lymph nodes measuring up to 10 mm short axis (series 2/images 96 and 102).  Additional retroperitoneal/pelvic lymphadenopathy, including: --14 mm short-axis left para-aortic node (series 2/image 64) --11 mm short-axis left common iliac node (series 2/image 82) --13 mm short axis left common iliac node (series 2/image 91) --9 mm short-axis left external iliac node (series 2/image 97)  Atherosclerotic calcifications of the abdominal aorta and branch vessels.  No abdominopelvic ascites.  Status post hysterectomy.  Bilateral ovaries are unremarkable.  Bladder is within normal limits.  Degenerative changes of the lumbar spine.  IMPRESSION: Eccentric mass near the rectosigmoid junction, compatible with primary rectal adenocarcinoma.  Associated retroperitoneal/pelvic nodal metastases, as described above.  11 mm hypoenhancing lesion in the lateral segment left hepatic lobe, suspicious for metastasis.  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  Original Report Authenticated By: Charline Bills, M.D.    Dg Chest Port 1 View  01/09/2013  *RADIOLOGY REPORT*   Clinical Data: Abdominal pain with gaseous distension.  History of hypertension and gastroesophageal reflux.  PORTABLE CHEST - 1 VIEW  Comparison: 02/21/2008 radiographs; CT 08/29/2010.  Findings: 0700 hours.  The heart size and mediastinal contours are stable.  There is a small nodular density superimposed over the posterior aspect of the right ninth rib.  There is no corresponding finding on the prior studies.  The lungs are otherwise clear and there is no pleural effusion or pneumothorax.  Telemetry leads overlie the chest.  IMPRESSION:  1.  No acute cardiopulmonary process identified. 2.  Cannot exclude right basilar pulmonary nodule.  This could be due to an overlap of vascular and osseous structures. Unless CT is indicated for other reasons, I would suggest initial assessment with a repeat PA view.   Original Report Authenticated By: Carey Bullocks, M.D.        IMPRESSION: The patient has a recent diagnosis of rectal cancer and the patient's staging studies are consistent with stage IV  disease. She has not had significant GI symptoms leading up to this other than recent GI bleed. 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 Biopsies have been negative for malignancy and in light of her locally advanced rectal cancer which has been discovered, it is not clear that this is related to any current symptoms.   PLAN: The patient I believe is an appropriate candidate for palliative radiotherapy at this time. She has had some significant bleeding and I believe that she will be at risk for obstruction as well without local treatment. I discussed this with the patient including a potential 3 week course of palliative radiotherapy. She is scheduled to see medical oncology as well and potential concurrent chemotherapy could also be considered in addition to subsequent systemic treatment.  We discussed the rationale of palliative  radiotherapy in this setting in addition to the possible side effects and risks of treatment. All of her questions were answered and she does to proceed with such a treatment. All of her questions were answered.  The patient will be scheduled for simulation in the near future such that we can proceed with treatment planning.    I spent 60 minutes minutes face to face with the patient and more than 50% of that time was spent in counseling and/or coordination of care.    ________________________________   Radene Gunning, MD, PhD

## 2013-01-19 ENCOUNTER — Ambulatory Visit
Admission: RE | Admit: 2013-01-19 | Discharge: 2013-01-19 | Disposition: A | Discharge status: Home / Self Care | Payer: Medicare Other | Source: Ambulatory Visit | Attending: Radiation Oncology | Admitting: Radiation Oncology

## 2013-01-19 DIAGNOSIS — Z510 Encounter for antineoplastic radiation therapy: Secondary | ICD-10-CM | POA: Insufficient documentation

## 2013-01-19 DIAGNOSIS — C20 Malignant neoplasm of rectum: Secondary | ICD-10-CM

## 2013-01-19 DIAGNOSIS — Z79899 Other long term (current) drug therapy: Secondary | ICD-10-CM | POA: Insufficient documentation

## 2013-01-20 ENCOUNTER — Telehealth: Payer: Self-pay | Admitting: *Deleted

## 2013-01-20 NOTE — Telephone Encounter (Signed)
Spoke with patient by phone and confirmed appointment for 01/23/13 with Dr. Truett Perna.

## 2013-01-20 NOTE — Addendum Note (Signed)
Encounter addended by: Jonna Coup, MD on: 01/20/2013  8:06 AM<BR>     Documentation filed: Notes Section

## 2013-01-20 NOTE — Progress Notes (Signed)
  Radiation Oncology         [phone] ________________________________  Name: DENNYS TRAUGHBER MRN: [id number]  Date: 01/19/2013  DOB: 05-Aug-1934  SIMULATION AND TREATMENT PLANNING NOTE  DIAGNOSIS:  Rectal cancer  NARRATIVE:  The patient was brought to the CT Simulation planning suite.  Identity was confirmed.  All relevant records and images related to the planned course of therapy were reviewed.   Written consent to proceed with treatment was confirmed which was freely given after reviewing the details related to the planned course of therapy had been reviewed with the patient.  Then, the patient was set-up in a stable reproducible  supine position for radiation therapy.  CT images were obtained.  Surface markings were placed.  A customized VAC lock bag was constructed to aid in patient immobilization. This complex treatment device will be used on a daily basis.  The CT images were loaded into the planning software.  Then the target and avoidance structures were contoured.  Treatment planning then occurred.  The radiation prescription was entered and confirmed.  A total of 4 complex treatment devices were fabricated which relate to the designed radiation treatment fields. Each of these customized fields/ complex treatment devices will be used on a daily basis during the radiation course. I have requested : 3D Simulation  I have requested a DVH of the following structures: Gross tumor volume, bladder, femoral heads bilaterally.    Daily image guidance will be used to ensure accurate localization of the target and adequate sparing of the normal tissue structures. The patient's tumor lies in the upper part of the rectum and we will follow the location of the target on a daily basis which may be mobile.  PLAN:  The patient will receive 37.5 Gy in 15 fractions.  ________________________________   Radene Gunning, MD, PhD

## 2013-01-20 NOTE — Addendum Note (Signed)
Encounter addended by: Delynn Flavin, RN on: 01/20/2013  7:20 PM<BR>     Documentation filed: Charges VN

## 2013-01-23 ENCOUNTER — Ambulatory Visit (HOSPITAL_BASED_OUTPATIENT_CLINIC_OR_DEPARTMENT_OTHER): Payer: Medicare Other | Admitting: Oncology

## 2013-01-23 ENCOUNTER — Ambulatory Visit: Payer: Medicare Other

## 2013-01-23 ENCOUNTER — Encounter: Payer: Self-pay | Admitting: Oncology

## 2013-01-23 VITALS — BP 117/75 | HR 90 | Temp 96.8°F | Resp 20 | Ht 63.0 in | Wt 171.8 lb

## 2013-01-23 DIAGNOSIS — R599 Enlarged lymph nodes, unspecified: Secondary | ICD-10-CM

## 2013-01-23 DIAGNOSIS — K7689 Other specified diseases of liver: Secondary | ICD-10-CM

## 2013-01-23 DIAGNOSIS — C20 Malignant neoplasm of rectum: Secondary | ICD-10-CM

## 2013-01-23 DIAGNOSIS — R918 Other nonspecific abnormal finding of lung field: Secondary | ICD-10-CM

## 2013-01-23 NOTE — Progress Notes (Signed)
Reports she is need of finding a local PCP-current PCP in [place] and day she was to see Dr. Bruna Potter, she began GI bleed and went to hospital.

## 2013-01-23 NOTE — Progress Notes (Signed)
[facility] New Patient Consult   Referring MD: Ameisha Mcclellan 79 y.o.  10/31/1933    Reason for Referral: Rectal cancer     HPI: She was admitted on 01/09/2013 with melena and severe anemia. She was transfused with packed red blood cells. Dr. Christella Hartigan. She has a history of a gastrointestinal stromal tumor of the stomach and this was felt to be the likely bleeding source. An upper endoscopy on 01/10/2013. A round soft epithelial mass was noted in the proximal stomach with overlying hyperemic mucosa. The mass measured 4 cm in diameter. Biopsies were obtained and there was bleeding after the biopsy.  CTs of the chest, abdomen, and pelvis were obtained on 01/10/2013. Numerous bilateral pulmonary nodules were suspicious for metastases. Mediastinal and left hilar lymphadenopathy was noted. The submucosal gastric mass at the lesser curvature measures 5.5 x 4.3 cm compared to a previous measurement a 4.9 x 3.3 cm in 2011-11 mm hypoenhancing lesion in the lateral segment left liver is new and suspicious for metastasis. A mass was noted at the rectosigmoid junction compatible with a rectal adenocarcinoma. Adjacent perirectal lymph nodes measured up to 2 mm. Additional retroperitoneal/pelvic lymphadenopathy was seen.  She was taken to a colonoscopy procedure on 01/11/2013. A 5 cm circumferential near obstructing mass was noted in the proximal rectum with the distal edge 8-9 cm from the anal verge a 3 Center polyp was noted in the sigmoid colon. Biopsy of the rectal mass confirmed invasive adenocarcinoma. A biopsy of the stomach mass on 01/10/2013 revealed no malignancy.  She was placed on iron and reports dark stools since starting iron. She feels well at present. She was referred to Dr. Mitzi Hansen and is scheduled to begin palliative radiation to the rectum on 01/26/2013.  Past Medical History  Diagnosis Date  . Glaucoma(365)   . Hypertension   . Anxiety   . GERD  (gastroesophageal reflux disease)   . Hemorrhoids   . Hyperlipidemia   . Cancer 01/11/13    rectal   .    Gastrointestinal stromal tumor of the stomach                                              2011  .    G1 P1 Past Surgical History  Procedure Laterality Date  . Esophagogastroduodenoscopy N/A 01/10/2013    Procedure: ESOPHAGOGASTRODUODENOSCOPY (EGD);  Surgeon: Rachael Fee, MD;  Location: [facility];  Service: Endoscopy;  Laterality: N/A;  . Colonoscopy N/A 01/11/2013    Procedure: COLONOSCOPY;  Surgeon: Rachael Fee, MD;  Location: [facility];  Service: Endoscopy;  Laterality: N/A;  . Biopsy stomach  01/10/13    mild chronic inflammation, no evidence of malignancy, dysplasia  . Biopsy rectal  01/11/13    proximal mass-invasive adenocarcinoma  . Abdominal hysterectomy      many yrs ago, partial    Family History  Problem Relation Age of Onset  . Cancer Sister     rectal  . Hyperlipidemia Sister   . Hypertension Sister   . Stroke Mother   . Hypertension Father   . Cancer Brother     prostate, seed implant  8 sisters and one brother, no other family history of cancer  Current outpatient prescriptions:ALPRAZolam (XANAX) 0.5 MG tablet, Take 1 tablet (0.5 mg total) by mouth at bedtime as needed for  sleep., Disp: 30 tablet, Rfl: 0;  benazepril-hydrochlorthiazide (LOTENSIN HCT) 20-25 MG per tablet, Take 1 tablet by mouth 2 (two) times daily. , Disp: , Rfl: ;  ferrous sulfate 325 (65 FE) MG tablet, Take 1 tablet (325 mg total) by mouth 3 (three) times daily with meals., Disp: 90 tablet, Rfl: 1 latanoprost (XALATAN) 0.005 % ophthalmic solution, 1 drop at bedtime., Disp: , Rfl: ;  verapamil (CALAN-SR) 120 MG CR tablet, Take 120 mg by mouth 2 (two) times daily., Disp: , Rfl:   Allergies: No Known Allergies  Social History: She lives with her grandson and a niece in [place]. She is retired Psychologist, forensic. She does not use tobacco or alcohol. She was transfused in 2011. No risk  factor for HIV or hepatitis.  ROS:   Positives include: Abdominal pain in January 2014-treated for helicobacter, "jelly "like substance in the rectum beginning in January 2014  A complete ROS was otherwise negative.  Physical Exam:  Blood pressure 117/75, pulse 90, temperature 96.8 F (36 C), temperature source Oral, resp. rate 20, height 5\' 3"  (1.6 m), weight 171 lb 12.8 oz (77.928 kg).  HEENT: Oropharynx without visible mass, neck without mass Lungs: Clear bilaterally Cardiac: Regular rate and rhythm Abdomen: No hepatomegaly, nontender, no mass  Vascular: No leg edema Lymph nodes: No cervical, supraclavicular, or inguinal nodes.? 1/2 cm high right axillary node Neurologic: Alert and oriented, the motor exam appears intact in the upper and lower extremities Skin: No rash Musculoskeletal: No spine tenderness   LAB:  CBC  Lab Results  Component Value Date   WBC 5.8 01/12/2013   HGB 8.9* 01/12/2013   HCT 26.5* 01/12/2013   MCV 85.5 01/12/2013   PLT 172 01/12/2013     CMP      Component Value Date/Time   NA 143 01/11/2013 0507   K 3.1* 01/11/2013 0507   CL 109 01/11/2013 0507   CO2 24 01/11/2013 0507   GLUCOSE 118* 01/11/2013 0507   BUN 7 01/11/2013 0507   CREATININE 0.63 01/11/2013 0507   CALCIUM 8.5 01/11/2013 0507   PROT 6.1 01/09/2013 0640   ALBUMIN 3.4* 01/09/2013 0640   AST 18 01/09/2013 0640   ALT 12 01/09/2013 0640   ALKPHOS 45 01/09/2013 0640   BILITOT 0.3 01/09/2013 0640   GFRNONAA 85* 01/11/2013 0507   GFRAA >90 01/11/2013 0507     Radiology: As per history of present illness, I reviewed the CT scans from 01/10/2013 with Ms. Degante    Assessment/Plan:   1. Adenocarcinoma of the rectum-status post an endoscopic biopsy on 01/11/2013  2. Submucosal gastric mass-identified in 2011, slightly larger on the CT 01/10/2013  3. Staging CT scans 01/11/2013 consistent with a primary rectal tumor and metastatic abdominal/pelvic lymph nodes, chest lymph nodes, and bilateral pulmonary  nodules  4. Gastrointestinal bleeding-? Related to the submucosal gastric mass versus the rectal tumor  5. Severe anemia 01/09/2013-status post packed red blood cell transfusions, now maintained on iron   Disposition:   She has been diagnosed with rectal cancer. She appears to have metastatic rectal cancer based on the staging CT scan and pattern of tumor spread. Ms. Copado understands no therapy will be curative. The tumor is near obstructing. She is scheduled to begin a course of palliative radiation on 01/26/2013.  The source of recent gastrointestinal bleeding is not completely clear. She is at risk for bleeding from both the gastric and rectal tumors.  I recommend concurrent capecitabine with the radiation. We reviewed the  potential toxicities associated with capecitabine including the chance for mucositis, diarrhea, and hematologic toxicity. We discussed the skin rash, hyperpigmentation, and hand/foot syndrome associated with capecitabine. We also discussed the skin breakdown associated with capecitabine/radiation.  She will return for a chemotherapy teaching class and baseline CBC/CEA within the next few days.  We will see her during the palliative radiation to discuss additional systemic treatment options. Her case will be presented at the GI tumor conference on 01/25/2013.  Keath Matera 01/23/2013, 6:19 PM

## 2013-01-23 NOTE — Progress Notes (Addendum)
Met with patient and gave information re: colorectal cancer.  Referral made to dietician.  KRAS requested per pathology.  Will continue to follow as needed. GD

## 2013-01-23 NOTE — Progress Notes (Signed)
Checked in new patient for dr appt

## 2013-01-24 ENCOUNTER — Other Ambulatory Visit: Payer: Self-pay | Admitting: *Deleted

## 2013-01-24 ENCOUNTER — Telehealth: Payer: Self-pay | Admitting: Oncology

## 2013-01-24 MED ORDER — CAPECITABINE 500 MG PO TABS: 1500.0000 mg | ORAL_TABLET | Freq: Two times a day (BID) | ORAL | Status: DC

## 2013-01-24 NOTE — Telephone Encounter (Signed)
s.w. pt and advised on lab and chemo edu for tomorrow.Marland KitchenMarland Kitchenpt will also come by tomorrow to pick up schedule.Marland KitchenMarland Kitchen

## 2013-01-25 ENCOUNTER — Telehealth: Payer: Self-pay | Admitting: *Deleted

## 2013-01-25 ENCOUNTER — Other Ambulatory Visit (HOSPITAL_BASED_OUTPATIENT_CLINIC_OR_DEPARTMENT_OTHER): Payer: Medicare Other | Admitting: Lab

## 2013-01-25 ENCOUNTER — Encounter: Payer: Self-pay | Admitting: *Deleted

## 2013-01-25 ENCOUNTER — Other Ambulatory Visit: Payer: Medicare Other

## 2013-01-25 DIAGNOSIS — C20 Malignant neoplasm of rectum: Secondary | ICD-10-CM

## 2013-01-25 LAB — CBC WITH DIFFERENTIAL/PLATELET
Basophils Absolute: 0 10*3/uL (ref 0.0–0.1)
EOS%: 3 % (ref 0.0–7.0)
Eosinophils Absolute: 0.2 10*3/uL (ref 0.0–0.5)
HCT: 33.8 % — ABNORMAL LOW (ref 34.8–46.6)
HGB: 11 g/dL — ABNORMAL LOW (ref 11.6–15.9)
MCH: 29.5 pg (ref 25.1–34.0)
NEUT#: 4 10*3/uL (ref 1.5–6.5)
NEUT%: 69.8 % (ref 38.4–76.8)
lymph#: 1.2 10*3/uL (ref 0.9–3.3)

## 2013-01-25 LAB — BASIC METABOLIC PANEL (CC13)
BUN: 16.3 mg/dL (ref 7.0–26.0)
CO2: 28 mEq/L (ref 22–29)
Chloride: 104 mEq/L (ref 98–107)
Glucose: 97 mg/dl (ref 70–99)
Potassium: 3.7 mEq/L (ref 3.5–5.1)

## 2013-01-25 NOTE — Telephone Encounter (Signed)
Call from [facility] with [facility] reporting when they contacted pt to discuss insurance and copay information she said she can not take pills. Pt is scheduled for chemo education class today. Will address this with pt at that time.

## 2013-01-25 NOTE — Telephone Encounter (Signed)
No additional note

## 2013-01-25 NOTE — Telephone Encounter (Signed)
No other note. 

## 2013-01-26 ENCOUNTER — Telehealth: Payer: Self-pay | Admitting: *Deleted

## 2013-01-26 ENCOUNTER — Other Ambulatory Visit: Payer: Self-pay | Admitting: *Deleted

## 2013-01-26 NOTE — Telephone Encounter (Signed)
Called [facility] to cancel Xeloda prescription. Pt declined chemotherapy treatment.

## 2013-01-27 ENCOUNTER — Ambulatory Visit
Admission: RE | Admit: 2013-01-27 | Discharge: 2013-01-27 | Disposition: A | Discharge status: Home / Self Care | Payer: Medicare Other | Source: Ambulatory Visit | Attending: Radiation Oncology | Admitting: Radiation Oncology

## 2013-01-27 ENCOUNTER — Other Ambulatory Visit (HOSPITAL_COMMUNITY)
Admission: RE | Admit: 2013-01-27 | Discharge: 2013-01-27 | Disposition: A | Discharge status: Home / Self Care | Payer: Medicare Other | Source: Ambulatory Visit | Attending: Oncology | Admitting: Oncology

## 2013-01-27 DIAGNOSIS — C20 Malignant neoplasm of rectum: Secondary | ICD-10-CM

## 2013-01-30 ENCOUNTER — Ambulatory Visit: Payer: Medicare Other | Admitting: Gastroenterology

## 2013-01-30 ENCOUNTER — Ambulatory Visit
Admission: RE | Admit: 2013-01-30 | Discharge: 2013-01-30 | Disposition: A | Discharge status: Home / Self Care | Payer: Medicare Other | Source: Ambulatory Visit | Attending: Radiation Oncology | Admitting: Radiation Oncology

## 2013-01-31 ENCOUNTER — Telehealth: Payer: Self-pay | Admitting: *Deleted

## 2013-01-31 ENCOUNTER — Ambulatory Visit
Admission: RE | Admit: 2013-01-31 | Discharge: 2013-01-31 | Disposition: A | Discharge status: Home / Self Care | Payer: Medicare Other | Source: Ambulatory Visit | Attending: Radiation Oncology | Admitting: Radiation Oncology

## 2013-01-31 ENCOUNTER — Ambulatory Visit: Payer: Medicare Other | Admitting: Nutrition

## 2013-01-31 NOTE — Telephone Encounter (Signed)
Received phone call from [facility] stating she met with patient for nutrition appointment today and patient told her she was not taking Xeloda because she could not swallow the pills.   Per Lesle Reek, patient stated she wants the "injection".  Patient's radiation started today. Dr. Kalman Drape nurse informed.

## 2013-01-31 NOTE — Progress Notes (Signed)
This is a 79 year old female patient of Dr. Truett Perna diagnosed with rectal cancer. Patient has begun radiation therapy.  Past medical history includes hypertension, anxiety, GERD, and hyperlipidemia.  Medications include Xanax, ferrous sulfate, and Xeloda.  Labs include potassium 3.1 and glucose 118 on April 9.  Height: 63 inches. Weight: 171.8 pounds.  Usual body weight: 170 pounds. BMI: 30.44.  Patient reports difficulty swallowing pills. She expresses concern about taking oral Xeloda and therefore has not started it. She states she would like to talk to her doctor about this. She denies nutrition side effects at this time. Patient verbalizes concern about weight loss. She consumes Ensure Plus or ensure complete twice a day.  Nutrition diagnosis: Food and nutrition related knowledge deficit related to new diagnosis of rectal cancer and associated treatments as evidenced by no prior need for nutrition related information.  Intervention: Patient was educated to consume adequate calories and protein for weight maintenance. Patient was educated on potential side effects of treatment for rectal cancer. I provided fact sheets for her to take with her today. I've answered her questions. Contact information provided.  Monitoring, evaluation, goals: Patient will tolerate adequate calories and protein to minimize weight loss throughout treatment.  Next visit: Patient would like to contact me if she has any questions or concerns.

## 2013-02-01 ENCOUNTER — Ambulatory Visit
Admission: RE | Admit: 2013-02-01 | Discharge: 2013-02-01 | Disposition: A | Discharge status: Home / Self Care | Payer: Medicare Other | Source: Ambulatory Visit | Attending: Radiation Oncology | Admitting: Radiation Oncology

## 2013-02-01 ENCOUNTER — Telehealth: Payer: Self-pay | Admitting: *Deleted

## 2013-02-01 NOTE — Telephone Encounter (Signed)
Dr. Truett Perna informed this RN that he just spoke with patient by phone today at 12:45.  He stated the patient refuses the chemo pills due to inability to take pills and she refuses the continuous 5FU pump due to not wanting to carry a bag for the whole week.  Dr. Truett Perna stated he discussed all of this again with the patient and she does not want this type of treatment with the concurrent RT.   Dr. Truett Perna stated he is comfortable for patient to complete the palliative 3 weeks of RT and then re-group at next office visit on 02/15/13 to decide on systemic therapy.

## 2013-02-02 ENCOUNTER — Ambulatory Visit
Admission: RE | Admit: 2013-02-02 | Discharge: 2013-02-02 | Disposition: A | Discharge status: Home / Self Care | Payer: Medicare Other | Source: Ambulatory Visit | Attending: Radiation Oncology | Admitting: Radiation Oncology

## 2013-02-02 ENCOUNTER — Telehealth: Payer: Self-pay | Admitting: *Deleted

## 2013-02-02 ENCOUNTER — Encounter: Payer: Self-pay | Admitting: Radiation Oncology

## 2013-02-02 VITALS — BP 114/67 | HR 91 | Temp 97.0°F | Resp 16 | Wt 171.9 lb

## 2013-02-02 DIAGNOSIS — C20 Malignant neoplasm of rectum: Secondary | ICD-10-CM

## 2013-02-02 NOTE — Progress Notes (Signed)
Patient presents to the clinic today requesting to see Dr. Mitzi Hansen today instead of tomorrow for PUT because she is very concerned about left upper arm pain. Patient is alert and oriented to person, place, and time. No distress noted. Steady gait noted. Pleasant affect noted. Patient reports left shoulder at joint and left humerus pain 2 on a scale of 0-10. Patient reports that this pain has been present for three days. Patient denies diarrhea but, does report loose stool. Patient reports taking daily doses of iron which make her stool very dark. Patient reports increased flatus. Patient denies vaginal discharge normally but, reports yesterday that she pass a small amount of blood tinged mucus. Patient denies vaginal itching or odor. Patient reports wearing a pad daily because of occasional stool incontinence. Reported all findings to Dr. Mitzi Hansen.

## 2013-02-02 NOTE — Progress Notes (Signed)
   Department of Radiation Oncology  Phone:  [phone] Fax:        [phone]  Weekly Treatment Note    Name: Katie [name] Date: 02/02/2013 MRN: [id number] DOB: April 29, 1934   Current dose: 10 Gy  Current fraction: 4   MEDICATIONS: Current Outpatient Prescriptions  Medication Sig Dispense Refill  . ALPRAZolam (XANAX) 0.5 MG tablet Take 1 tablet (0.5 mg total) by mouth at bedtime as needed for sleep.  30 tablet  0  . benazepril-hydrochlorthiazide (LOTENSIN HCT) 20-25 MG per tablet Take 1 tablet by mouth 2 (two) times daily.       . ferrous sulfate 325 (65 FE) MG tablet Take 1 tablet (325 mg total) by mouth 3 (three) times daily with meals.  90 tablet  1  . latanoprost (XALATAN) 0.005 % ophthalmic solution 1 drop at bedtime.      . verapamil (CALAN-SR) 120 MG CR tablet Take 120 mg by mouth 2 (two) times daily.       No current facility-administered medications for this encounter.     ALLERGIES: Review of patient's allergies indicates no known allergies.   LABORATORY DATA:  Lab Results  Component Value Date   WBC 5.8 01/25/2013   HGB 11.0* 01/25/2013   HCT 33.8* 01/25/2013   MCV 90.4 01/25/2013   PLT 281 01/25/2013   Lab Results  Component Value Date   NA 141 01/25/2013   K 3.7 01/25/2013   CL 104 01/25/2013   CO2 28 01/25/2013   Lab Results  Component Value Date   ALT 12 01/09/2013   AST 18 01/09/2013   ALKPHOS 45 01/09/2013   BILITOT 0.3 01/09/2013     NARRATIVE: Katie [name] was seen today for weekly treatment management. The chart was checked and the patient's films were reviewed. The patient asked to be seen today. She is complaining of some pain in the left upper arm. She states a little bit of soreness in the muscle in the biceps region. She has been reading about cancer and is concerned that cancer has spread to this area. She wanted to know if this is possible.  PHYSICAL EXAMINATION: weight is 171 lb 14.4 oz (77.973 kg). Her oral temperature is 97 F (36.1 C).  Her blood pressure is 114/67 and her pulse is 91. Her respiration is 16 and oxygen saturation is 100%.      no tenderness on exam. No nodularity/tumor present  ASSESSMENT: The patient is doing satisfactorily with treatment.  PLAN: We will continue with the patient's radiation treatment as planned. I discussed with the patient that really anything is technically possible with cancer. We will follow this area but there were no concerning findings on exam today. This has been a dull ache for several days like muscle soreness. She can take over-the-counter pain medicine as needed. She is to let us know if this pain worsens.

## 2013-02-03 ENCOUNTER — Ambulatory Visit
Admission: RE | Admit: 2013-02-03 | Discharge: 2013-02-03 | Disposition: A | Discharge status: Home / Self Care | Payer: Medicare Other | Source: Ambulatory Visit | Attending: Radiation Oncology | Admitting: Radiation Oncology

## 2013-02-03 NOTE — Progress Notes (Signed)
Routine of clinic reviewed.Patient education performed regarding side effects to pelvis/rectum to include cystitis, bowel changes, pain, fatigue and skin irritation.Patient will not be taking xeloda as she has difficulty with swallowing pills.Patient to to follow up with Dr.Sherrill to discuss whether to take systemic chemotherapy or not.Patient stated that she is supposed to be called in regard to having a "port placed in chest".I did explain to patient she has appointment in med/onc on 02/15/13.

## 2013-02-06 ENCOUNTER — Ambulatory Visit
Admission: RE | Admit: 2013-02-06 | Discharge: 2013-02-06 | Disposition: A | Discharge status: Home / Self Care | Payer: Medicare Other | Source: Ambulatory Visit | Attending: Radiation Oncology | Admitting: Radiation Oncology

## 2013-02-07 ENCOUNTER — Ambulatory Visit
Admission: RE | Admit: 2013-02-07 | Discharge: 2013-02-07 | Disposition: A | Discharge status: Home / Self Care | Payer: Medicare Other | Source: Ambulatory Visit | Attending: Radiation Oncology | Admitting: Radiation Oncology

## 2013-02-08 ENCOUNTER — Ambulatory Visit
Admission: RE | Admit: 2013-02-08 | Discharge: 2013-02-08 | Disposition: A | Discharge status: Home / Self Care | Payer: Medicare Other | Source: Ambulatory Visit | Attending: Radiation Oncology | Admitting: Radiation Oncology

## 2013-02-09 ENCOUNTER — Ambulatory Visit
Admission: RE | Admit: 2013-02-09 | Discharge: 2013-02-09 | Disposition: A | Discharge status: Home / Self Care | Payer: Medicare Other | Source: Ambulatory Visit | Attending: Radiation Oncology | Admitting: Radiation Oncology

## 2013-02-10 ENCOUNTER — Ambulatory Visit
Admission: RE | Admit: 2013-02-10 | Discharge: 2013-02-10 | Disposition: A | Discharge status: Home / Self Care | Payer: Medicare Other | Source: Ambulatory Visit | Attending: Radiation Oncology | Admitting: Radiation Oncology

## 2013-02-10 VITALS — BP 119/68 | HR 67 | Temp 98.7°F | Wt 172.0 lb

## 2013-02-10 DIAGNOSIS — C20 Malignant neoplasm of rectum: Secondary | ICD-10-CM

## 2013-02-10 NOTE — Progress Notes (Signed)
Patient for routine weekly assessment of radiation of pelvis for rectal cancer.No bowel problems.New today is sensation of sharp pain over bladder.Patient states she has been drinking lots of water and cranberry juice, no change in urine color.

## 2013-02-10 NOTE — Progress Notes (Signed)
   Department of Radiation Oncology  Phone:  [phone] Fax:        [phone]  Weekly Treatment Note    Name: Katie [name] Date: 02/10/2013 MRN: [id number] DOB: [birth year]   Current dose: 25 Gy  Current fraction: 10   MEDICATIONS: Current Outpatient Prescriptions  Medication Sig Dispense Refill  . ALPRAZolam (XANAX) 0.5 MG tablet Take 1 tablet (0.5 mg total) by mouth at bedtime as needed for sleep.  30 tablet  0  . benazepril-hydrochlorthiazide (LOTENSIN HCT) 20-25 MG per tablet Take 1 tablet by mouth 2 (two) times daily.       . ferrous sulfate 325 (65 FE) MG tablet Take 1 tablet (325 mg total) by mouth 3 (three) times daily with meals.  90 tablet  1  . latanoprost (XALATAN) 0.005 % ophthalmic solution 1 drop at bedtime.      . verapamil (CALAN-SR) 120 MG CR tablet Take 120 mg by mouth 2 (two) times daily.       No current facility-administered medications for this encounter.     ALLERGIES: Review of patient's allergies indicates no known allergies.   LABORATORY DATA:  Lab Results  Component Value Date   WBC 5.8 01/25/2013   HGB 11.0* 01/25/2013   HCT 33.8* 01/25/2013   MCV 90.4 01/25/2013   PLT 281 01/25/2013   Lab Results  Component Value Date   NA 141 01/25/2013   K 3.7 01/25/2013   CL 104 01/25/2013   CO2 28 01/25/2013   Lab Results  Component Value Date   ALT 12 01/09/2013   AST 18 01/09/2013   ALKPHOS 45 01/09/2013   BILITOT 0.3 01/09/2013     NARRATIVE: Katie [name] was seen today for weekly treatment management. The chart was checked and the patient's films were reviewed. The patient had an episode of pelvic discomfort after urination. No burning with urination, color change or hematuria.  PHYSICAL EXAMINATION: weight is 172 lb (78.019 kg). Her temperature is 98.7 F (37.1 C). Her blood pressure is 119/68 and her pulse is 67.        ASSESSMENT: The patient is doing satisfactorily with treatment. I discussed with the patient that she can have some urinary  changes with pelvic radiation. If her symptoms persist/worsen, we can also get a urinalysis next week.  PLAN: We will continue with the patient's radiation treatment as planned.

## 2013-02-11 ENCOUNTER — Ambulatory Visit
Admission: RE | Admit: 2013-02-11 | Discharge: 2013-02-11 | Disposition: A | Discharge status: Home / Self Care | Payer: Medicare Other | Source: Ambulatory Visit | Attending: Radiation Oncology | Admitting: Radiation Oncology

## 2013-02-13 ENCOUNTER — Ambulatory Visit
Admission: RE | Admit: 2013-02-13 | Discharge: 2013-02-13 | Disposition: A | Discharge status: Home / Self Care | Payer: Medicare Other | Source: Ambulatory Visit | Attending: Radiation Oncology | Admitting: Radiation Oncology

## 2013-02-14 ENCOUNTER — Ambulatory Visit
Admission: RE | Admit: 2013-02-14 | Discharge: 2013-02-14 | Disposition: A | Discharge status: Home / Self Care | Payer: Medicare Other | Source: Ambulatory Visit | Attending: Radiation Oncology | Admitting: Radiation Oncology

## 2013-02-15 ENCOUNTER — Ambulatory Visit
Admission: RE | Admit: 2013-02-15 | Discharge: 2013-02-15 | Disposition: A | Discharge status: Home / Self Care | Payer: Medicare Other | Source: Ambulatory Visit | Attending: Radiation Oncology | Admitting: Radiation Oncology

## 2013-02-15 ENCOUNTER — Other Ambulatory Visit (HOSPITAL_BASED_OUTPATIENT_CLINIC_OR_DEPARTMENT_OTHER): Payer: Medicare Other | Admitting: Lab

## 2013-02-15 ENCOUNTER — Telehealth: Payer: Self-pay | Admitting: Oncology

## 2013-02-15 ENCOUNTER — Encounter: Payer: Medicare Other | Admitting: Nurse Practitioner

## 2013-02-15 DIAGNOSIS — C20 Malignant neoplasm of rectum: Secondary | ICD-10-CM

## 2013-02-15 LAB — CBC WITH DIFFERENTIAL/PLATELET
BASO%: 0.2 % (ref 0.0–2.0)
EOS%: 5.9 % (ref 0.0–7.0)
LYMPH%: 16.6 % (ref 14.0–49.7)
MCH: 30.5 pg (ref 25.1–34.0)
MCHC: 32.9 g/dL (ref 31.5–36.0)
MCV: 92.5 fL (ref 79.5–101.0)
MONO%: 7.6 % (ref 0.0–14.0)
NEUT%: 69.7 % (ref 38.4–76.8)
Platelets: 219 10*3/uL (ref 145–400)
RBC: 3.61 10*6/uL — ABNORMAL LOW (ref 3.70–5.45)
WBC: 4.9 10*3/uL (ref 3.9–10.3)

## 2013-02-15 NOTE — Progress Notes (Signed)
No encounter

## 2013-02-15 NOTE — Telephone Encounter (Signed)
s.w. pt and advised on 5.20.14 appt...pt ok and aware °

## 2013-02-16 ENCOUNTER — Ambulatory Visit
Admission: RE | Admit: 2013-02-16 | Discharge: 2013-02-16 | Disposition: A | Discharge status: Home / Self Care | Payer: Medicare Other | Source: Ambulatory Visit | Attending: Radiation Oncology | Admitting: Radiation Oncology

## 2013-02-17 ENCOUNTER — Ambulatory Visit
Admission: RE | Admit: 2013-02-17 | Discharge: 2013-02-17 | Disposition: A | Discharge status: Home / Self Care | Payer: Medicare Other | Source: Ambulatory Visit | Attending: Radiation Oncology | Admitting: Radiation Oncology

## 2013-02-17 ENCOUNTER — Encounter: Payer: Self-pay | Admitting: Radiation Oncology

## 2013-02-17 DIAGNOSIS — C20 Malignant neoplasm of rectum: Secondary | ICD-10-CM

## 2013-02-17 NOTE — Progress Notes (Signed)
  Radiation Oncology         [phone] ________________________________  Name: Katie [name] MRN: [id number]  Date: 02/17/2013  DOB: [birth year]  End of Treatment Note  Diagnosis:   Rectal cancer     Indication for treatment:  palliative       Radiation treatment dates:  01/30/2013 through 02/17/2013  Site/dose:   The patient was treated to the pelvic region to the site of gross disease within the rectum to a dose of 37.5 gray at 2.5 gray per fraction. This was completed using a 4 field 3-D conformal technique.  Narrative: The patient tolerated radiation treatment relatively well.   The patient did not exhibit significant problems with acute toxicity during treatment. No diarrhea or nausea and no blood per rectum at the end of treatment.  Plan: The patient has completed radiation treatment. The patient will return to radiation oncology clinic for routine followup in one month. I advised the patient to call or return sooner if they have any questions or concerns related to their recovery or treatment. ________________________________  Radene Gunning, M.D., Ph.D.

## 2013-02-17 NOTE — Progress Notes (Signed)
   Department of Radiation Oncology  Phone:  [phone] Fax:        [phone]  Weekly Treatment Note    Name: JALEIGH MCCROSKEY Date: 02/17/2013 MRN: [id number] DOB: 1934-05-21   Current dose: 37.5  Gy  Current fraction:15   MEDICATIONS: Current Outpatient Prescriptions  Medication Sig Dispense Refill  . ALPRAZolam (XANAX) 0.5 MG tablet Take 1 tablet (0.5 mg total) by mouth at bedtime as needed for sleep.  30 tablet  0  . benazepril-hydrochlorthiazide (LOTENSIN HCT) 20-25 MG per tablet Take 1 tablet by mouth 2 (two) times daily.       . ferrous sulfate 325 (65 FE) MG tablet Take 1 tablet (325 mg total) by mouth 3 (three) times daily with meals.  90 tablet  1  . latanoprost (XALATAN) 0.005 % ophthalmic solution 1 drop at bedtime.      . verapamil (CALAN-SR) 120 MG CR tablet Take 120 mg by mouth 2 (two) times daily.       No current facility-administered medications for this encounter.     ALLERGIES: Review of patient's allergies indicates no known allergies.   LABORATORY DATA:  Lab Results  Component Value Date   WBC 4.9 02/15/2013   HGB 11.0* 02/15/2013   HCT 33.4* 02/15/2013   MCV 92.5 02/15/2013   PLT 219 02/15/2013   Lab Results  Component Value Date   NA 141 01/25/2013   K 3.7 01/25/2013   CL 104 01/25/2013   CO2 28 01/25/2013   Lab Results  Component Value Date   ALT 12 01/09/2013   AST 18 01/09/2013   ALKPHOS 45 01/09/2013   BILITOT 0.3 01/09/2013     NARRATIVE: SHERLIE BOYUM was seen today for weekly treatment management. The chart was checked and the patient's films were reviewed. The patient finished her final fraction of radiotherapy today. She did well. No significant complaints today. No diarrhea, no fatigue. No blood per stool.  PHYSICAL EXAMINATION: Blood pressure 111/62 pulse 61 temperature 98.9      ASSESSMENT: The patient did satisfactorily with treatment.  PLAN: Followup in one month.

## 2013-02-17 NOTE — Progress Notes (Signed)
Patient here for weekly assessment rectal radiation.Patient completes today total of 15 of 15 treatments.Denies pain.Has some urinary incontinence at times.Denies diarrhea, no fatigue.scheduled to see medical oncology next week to discuss Possible porta-cath placement and chemotherapy as patient unable to take oral chemotherapy related to difficulty swallowing.One month follow up to be scheduled.

## 2013-02-21 ENCOUNTER — Ambulatory Visit (HOSPITAL_BASED_OUTPATIENT_CLINIC_OR_DEPARTMENT_OTHER): Payer: Medicare Other | Admitting: Nurse Practitioner

## 2013-02-21 ENCOUNTER — Telehealth: Payer: Self-pay | Admitting: Oncology

## 2013-02-21 VITALS — BP 117/68 | HR 80 | Temp 97.6°F | Resp 18 | Ht 63.0 in | Wt 169.7 lb

## 2013-02-21 DIAGNOSIS — D649 Anemia, unspecified: Secondary | ICD-10-CM

## 2013-02-21 DIAGNOSIS — C20 Malignant neoplasm of rectum: Secondary | ICD-10-CM

## 2013-02-21 DIAGNOSIS — C778 Secondary and unspecified malignant neoplasm of lymph nodes of multiple regions: Secondary | ICD-10-CM

## 2013-02-21 DIAGNOSIS — R918 Other nonspecific abnormal finding of lung field: Secondary | ICD-10-CM

## 2013-02-21 NOTE — Telephone Encounter (Signed)
gave pt appt for tomorrow 02/22/13 MD only

## 2013-02-21 NOTE — Progress Notes (Signed)
OFFICE PROGRESS NOTE  Interval history:  Katie [name] returns as scheduled. She declined Xeloda due to difficulty swallowing pills. She declined continuous infusion 5-FU as well. She completed the course of radiation therapy on 02/17/2013. She has mild discomfort with bowel movements. Stools are formed. She notes stools are black due to oral iron. She denies shortness of breath. No cough.   Objective: Blood pressure 117/68, pulse 80, temperature 97.6 F (36.4 C), temperature source Oral, resp. rate 18, height 5\' 3"  (1.6 m), weight 169 lb 11.2 oz (76.975 kg).  Thrush or ulceration. Lungs are clear. Regular cardiac rhythm. Abdomen soft and nontender. No hepatomegaly. Extremities are without edema.  Lab Results: Lab Results  Component Value Date   WBC 4.9 02/15/2013   HGB 11.0* 02/15/2013   HCT 33.4* 02/15/2013   MCV 92.5 02/15/2013   PLT 219 02/15/2013    Chemistry:    Chemistry      Component Value Date/Time   NA 141 01/25/2013 0854   NA 143 01/11/2013 0507   K 3.7 01/25/2013 0854   K 3.1* 01/11/2013 0507   CL 104 01/25/2013 0854   CL 109 01/11/2013 0507   CO2 28 01/25/2013 0854   CO2 24 01/11/2013 0507   BUN 16.3 01/25/2013 0854   BUN 7 01/11/2013 0507   CREATININE 0.8 01/25/2013 0854   CREATININE 0.63 01/11/2013 0507      Component Value Date/Time   CALCIUM 9.2 01/25/2013 0854   CALCIUM 8.5 01/11/2013 0507   ALKPHOS 45 01/09/2013 0640   AST 18 01/09/2013 0640   ALT 12 01/09/2013 0640   BILITOT 0.3 01/09/2013 0640       Studies/Results: No results found.  Medications: I have reviewed the patient's current medications.  Assessment/Plan:  1. 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 She completed a course of palliative radiation 01/30/2013 through 02/17/2013. She declined Xeloda and continuous infusion 5 fluorouracil. 2. Submucosal gastric mass identified in 2011. Slightly larger on CT 01/10/2013. 3. Staging CT scans 01/11/2013 consistent with a primary rectal  tumor and metastatic abdominal/pelvic lymph nodes, chest lymph nodes and bilateral pulmonary nodules. 4. Gastrointestinal bleeding. Question related to the submucosal gastric mass versus the rectal tumor. 5. Severe anemia 01/09/2013. Status post packed red blood cell transfusions, now maintained on iron.  Disposition-Katie [name] has metastatic rectal cancer. She completed the course of palliative radiation therapy on 02/17/2013. She is seen today to discuss systemic treatment options.   Dr. Truett Perna recommended treatment on the FOLFOX regimen. We reviewed potential toxicities associated with oxaliplatin including myelosuppression, an allergic reaction, nausea, neurotoxicity including cold sensitivity, peripheral neuropathy and acute laryngopharyngeal dysesthesia. We also discussed the possibility of acute neurotoxicity with diplopia, ataxia. We discussed potential toxicities associated with 5-fluorouracil including mouth sores, nausea, diarrhea, skin hyperpigmentation, darkening of pre-existing skin lesions, hand-foot syndrome, conjunctivitis. We reviewed the need for a Port-A-Cath.  She does not want to take the 5-fluorouracil pump home. We discussed the CAPOX regimen. She is unable to take  Xeloda due to the size of the pills. We also discussed the FOLFIRI regimen which would also require a pump.  At the conclusion of today's visit she was undecided regarding proceeding with systemic therapy. She would like to consider her options overnight and return on 02/22/2013 for further discussion.  Plan reviewed with Dr. Truett Perna.  Lonna Cobb ANP/GNP-BC

## 2013-02-22 ENCOUNTER — Telehealth: Payer: Self-pay | Admitting: Oncology

## 2013-02-22 ENCOUNTER — Ambulatory Visit (HOSPITAL_BASED_OUTPATIENT_CLINIC_OR_DEPARTMENT_OTHER): Payer: Medicare Other | Admitting: Oncology

## 2013-02-22 VITALS — BP 122/71 | HR 74 | Temp 97.0°F | Resp 18 | Ht 63.0 in | Wt 170.4 lb

## 2013-02-22 DIAGNOSIS — K922 Gastrointestinal hemorrhage, unspecified: Secondary | ICD-10-CM

## 2013-02-22 DIAGNOSIS — C20 Malignant neoplasm of rectum: Secondary | ICD-10-CM

## 2013-02-22 DIAGNOSIS — K319 Disease of stomach and duodenum, unspecified: Secondary | ICD-10-CM

## 2013-02-22 MED ORDER — ALPRAZOLAM 0.5 MG PO TABS: 0.5000 mg | ORAL_TABLET | Freq: Every evening | ORAL | Status: AC | PRN

## 2013-02-22 NOTE — Progress Notes (Signed)
   [name]    OFFICE PROGRESS NOTE   INTERVAL HISTORY:   Katie [name] returns as scheduled. We discuss systemic treatment options in detail with her yesterday. She was given reading materials. She is very concerned about the potential for toxicity.  She complains of irregular bowel habits and rectal urgency. She is not using a stool softener. No other complaint.  Objective:  Vital signs in last 24 hours:  Blood pressure 122/71, pulse 74, temperature 97 F (36.1 C), temperature source Oral, resp. rate 18, height 5\' 3"  (1.6 m), weight 170 lb 6.4 oz (77.293 kg).    Physical exam-not performed today Lab Results:  Lab Results  Component Value Date   WBC 4.9 02/15/2013   HGB 11.0* 02/15/2013   HCT 33.4* 02/15/2013   MCV 92.5 02/15/2013   PLT 219 02/15/2013      Medications: I have reviewed the patient's current medications.  Assessment/Plan: 1. 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 She completed a course of palliative radiation 01/30/2013 through 02/17/2013. She declined Xeloda and continuous infusion 5 fluorouracil. 2. Submucosal gastric mass identified in 2011. Slightly larger on CT 01/10/2013. 3. Staging CT scans 01/11/2013 consistent with a primary rectal tumor and metastatic abdominal/pelvic lymph nodes, chest lymph nodes and bilateral pulmonary nodules. 4. Gastrointestinal bleeding. Question related to the submucosal gastric mass versus the rectal tumor. 5. Severe anemia 01/09/2013. Status post packed red blood cell transfusions, now maintained on iron. The anemia has improved.   Disposition:  [name] has metastatic rectal cancer. I reviewed the 01/10/2013 staging CTs with her today. I recommend systemic therapy. She will not commit to a course of systemic therapy at present. We discussed the FOLFOX and CAPOX chemotherapy regimens. We again reviewed the potential toxicities. She is very concerned about the potential for  neuropathy and skin toxicity.  Katie [name] requested a surgical consultation to consider removing the primary tumor. We will make a referral to Dr. Maisie Fus. She understands resection of the primary will not be curative. We will also schedule a restaging CT of the chest for within the next one week. She will begin a stool softener.  Katie [name] will return for an office visit and further discussion in one week.   Thornton Papas, MD  02/22/2013  2:26 PM

## 2013-02-22 NOTE — Telephone Encounter (Signed)
Talked to pt and gave her appt for 5/29 with ML

## 2013-02-23 ENCOUNTER — Encounter: Payer: Self-pay | Admitting: *Deleted

## 2013-03-01 ENCOUNTER — Ambulatory Visit (HOSPITAL_COMMUNITY)
Admission: RE | Admit: 2013-03-01 | Discharge: 2013-03-01 | Disposition: A | Discharge status: Home / Self Care | Payer: Medicare Other | Source: Ambulatory Visit | Attending: Oncology | Admitting: Oncology

## 2013-03-01 ENCOUNTER — Encounter (HOSPITAL_COMMUNITY): Payer: Self-pay

## 2013-03-01 DIAGNOSIS — R599 Enlarged lymph nodes, unspecified: Secondary | ICD-10-CM | POA: Insufficient documentation

## 2013-03-01 DIAGNOSIS — I2510 Atherosclerotic heart disease of native coronary artery without angina pectoris: Secondary | ICD-10-CM | POA: Insufficient documentation

## 2013-03-01 DIAGNOSIS — C20 Malignant neoplasm of rectum: Secondary | ICD-10-CM | POA: Insufficient documentation

## 2013-03-01 DIAGNOSIS — Z923 Personal history of irradiation: Secondary | ICD-10-CM | POA: Insufficient documentation

## 2013-03-01 DIAGNOSIS — K319 Disease of stomach and duodenum, unspecified: Secondary | ICD-10-CM | POA: Insufficient documentation

## 2013-03-01 DIAGNOSIS — C771 Secondary and unspecified malignant neoplasm of intrathoracic lymph nodes: Secondary | ICD-10-CM | POA: Insufficient documentation

## 2013-03-01 DIAGNOSIS — C7800 Secondary malignant neoplasm of unspecified lung: Secondary | ICD-10-CM | POA: Insufficient documentation

## 2013-03-01 DIAGNOSIS — C787 Secondary malignant neoplasm of liver and intrahepatic bile duct: Secondary | ICD-10-CM | POA: Insufficient documentation

## 2013-03-01 DIAGNOSIS — K7689 Other specified diseases of liver: Secondary | ICD-10-CM | POA: Insufficient documentation

## 2013-03-01 MED ORDER — IOHEXOL 300 MG/ML  SOLN
80.0000 mL | Freq: Once | INTRAMUSCULAR | Status: AC | PRN
  Administered 2013-03-01: 80 mL via INTRAVENOUS

## 2013-03-02 ENCOUNTER — Ambulatory Visit (HOSPITAL_BASED_OUTPATIENT_CLINIC_OR_DEPARTMENT_OTHER): Payer: Medicare Other | Admitting: Nurse Practitioner

## 2013-03-02 ENCOUNTER — Telehealth: Payer: Self-pay | Admitting: Oncology

## 2013-03-02 VITALS — BP 119/67 | HR 61 | Temp 96.8°F | Resp 18 | Ht 63.0 in | Wt 171.2 lb

## 2013-03-02 DIAGNOSIS — C20 Malignant neoplasm of rectum: Secondary | ICD-10-CM

## 2013-03-02 DIAGNOSIS — D649 Anemia, unspecified: Secondary | ICD-10-CM

## 2013-03-02 NOTE — Progress Notes (Signed)
OFFICE PROGRESS NOTE  Interval history:  Ms. Fambrough returns as scheduled. She reports bowel movements are more regular. She denies bleeding. No diarrhea. She denies pain. She has a good appetite.   Objective: Blood pressure 119/67, pulse 61, temperature 96.8 F (36 C), temperature source Oral, resp. rate 18, height 5\' 3"  (1.6 m), weight 171 lb 3.2 oz (77.656 kg).  No thrush or ulceration. Lungs clear. Regular cardiac rhythm. Abdomen soft and nontender. No hepatomegaly. Extremities without edema.  Lab Results: Lab Results  Component Value Date   WBC 4.9 02/15/2013   HGB 11.0* 02/15/2013   HCT 33.4* 02/15/2013   MCV 92.5 02/15/2013   PLT 219 02/15/2013    Chemistry:    Chemistry      Component Value Date/Time   NA 141 01/25/2013 0854   NA 143 01/11/2013 0507   K 3.7 01/25/2013 0854   K 3.1* 01/11/2013 0507   CL 104 01/25/2013 0854   CL 109 01/11/2013 0507   CO2 28 01/25/2013 0854   CO2 24 01/11/2013 0507   BUN 16.3 01/25/2013 0854   BUN 7 01/11/2013 0507   CREATININE 0.8 01/25/2013 0854   CREATININE 0.63 01/11/2013 0507      Component Value Date/Time   CALCIUM 9.2 01/25/2013 0854   CALCIUM 8.5 01/11/2013 0507   ALKPHOS 45 01/09/2013 0640   AST 18 01/09/2013 0640   ALT 12 01/09/2013 0640   BILITOT 0.3 01/09/2013 0640       Studies/Results: Ct Chest W Contrast  03/01/2013   *RADIOLOGY REPORT*  Clinical Data: Newly-diagnosed rectal carcinoma with recent radiation therapy.  Follow-up lung nodules.  CT CHEST WITH CONTRAST  Technique:  Multidetector CT imaging of the chest was performed following the standard protocol during bolus administration of intravenous contrast.  Contrast: 80mL OMNIPAQUE IOHEXOL 300 MG/ML  SOLN  Comparison: 01/10/2013, 08/29/2010 and 12/22/2007.  Findings: Left anterior scalene lymph nodes measure up to 10 mm (previously 8 mm).  Mediastinal lymph nodes have increased in prominence slightly in the interval as well.  Index high right paratracheal lymph node measures 13 mm (previously  7 mm).  A second index mediastinal lymph node in the low left paratracheal station measures 13 mm (previously 11 mm).  Bihilar adenopathy, as before. No axillary adenopathy. An 8 mm retrocrural lymph node (image 40) appears stable.  Atherosclerotic calcification of the arterial vasculature, including coronary arteries.  Heart size normal.  No pericardial effusion.  There are numerous bilateral pulmonary nodules seen in a hematogenous distribution.  Many of these appear minimally more prominent than on 01/10/2013.  Index right lower lobe nodule measures 9 x 9 mm (image 34), previously 7 x 8 mm.  No pleural fluid.  Airway is unremarkable.  Incidental imaging of the upper abdomen shows a stable 7 mm low attenuation lesion in the hepatic dome.  However, a somewhat ill- defined low attenuation lesion in the left hepatic lobe measures 1.4 x 1.7 cm (previously 0.8 x 1.0 cm). A gastric mass is again seen and better evaluated on 01/10/2013.  No worrisome lytic or sclerotic lesions.  IMPRESSION:  1.  Progressive metastatic disease as evidenced by enlarging left anterior scalene and mediastinal lymph nodes, enlarging pulmonary nodules, and an enlarging left hepatic lobe lesion. 2.  Gastric mass, better imaged on 01/10/2013.   Original Report Authenticated By: Leanna Battles, M.D.    Medications: I have reviewed the patient's current medications.  Assessment/Plan:  1. 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 She  completed a course of palliative radiation 01/30/2013 through 02/17/2013. She declined Xeloda and continuous infusion 5 fluorouracil. 2. Submucosal gastric mass identified in 2011. Slightly larger on CT 01/10/2013. 3. Staging CT scans 01/11/2013 consistent with a primary rectal tumor and metastatic abdominal/pelvic lymph nodes, chest lymph nodes and bilateral pulmonary nodules. Restaging chest CT 03/01/2013 showed progressive metastatic disease involving an enlarging left  anterior scalene lymph node and mediastinal lymph nodes, enlarging pulmonary nodules and an enlarging left hepatic lobe lesion. 4. Gastrointestinal bleeding. Question related to the submucosal gastric mass versus the rectal tumor. 5. Severe anemia 01/09/2013. Status post packed red blood cell transfusions, now maintained on iron. The anemia has improved.  Disposition-Ms. Lenart appears stable. Dr. Truett Perna reviewed the CT scan images on the computer with Ms. Clementeen Graham. We again recommended proceeding with systemic therapy. She remains very concerned regarding potential toxicities related to chemotherapy.  She would like to meet with Dr. Maisie Fus regarding resection of the primary tumor. Dr. Truett Perna reviewed her case with Dr. Maisie Fus and an appointment will be scheduled in the near future.  We will see her back on 03/14/2013 for further discussion. She will contact the office in the interim with any problems.   Patient seen with Dr. Truett Perna.  Lonna Cobb ANP/GNP-BC

## 2013-03-03 ENCOUNTER — Ambulatory Visit (INDEPENDENT_AMBULATORY_CARE_PROVIDER_SITE_OTHER): Payer: Medicare Other | Admitting: Surgery

## 2013-03-03 ENCOUNTER — Telehealth: Payer: Self-pay | Admitting: *Deleted

## 2013-03-03 ENCOUNTER — Telehealth: Payer: Self-pay | Admitting: Oncology

## 2013-03-03 ENCOUNTER — Encounter: Payer: Self-pay | Admitting: *Deleted

## 2013-03-03 NOTE — Telephone Encounter (Signed)
Notified pt of [facility] appt 03/10/13 to arrive at 1:30pm and appt at 2pm.  Pt verbalized understanding.

## 2013-03-03 NOTE — Telephone Encounter (Signed)
Talked to pt appt with Dr.Alicia thomas is scheduled for 6/19, gave pt directions and telephone number to [facility]

## 2013-03-08 ENCOUNTER — Other Ambulatory Visit: Payer: Self-pay | Admitting: Orthopedic Surgery

## 2013-03-08 ENCOUNTER — Ambulatory Visit
Admission: RE | Admit: 2013-03-08 | Discharge: 2013-03-08 | Disposition: A | Discharge status: Home / Self Care | Payer: Medicare Other | Source: Ambulatory Visit | Attending: Orthopedic Surgery | Admitting: Orthopedic Surgery

## 2013-03-08 DIAGNOSIS — M25512 Pain in left shoulder: Secondary | ICD-10-CM

## 2013-03-10 ENCOUNTER — Encounter (INDEPENDENT_AMBULATORY_CARE_PROVIDER_SITE_OTHER): Payer: Self-pay | Admitting: General Surgery

## 2013-03-10 ENCOUNTER — Ambulatory Visit (INDEPENDENT_AMBULATORY_CARE_PROVIDER_SITE_OTHER): Payer: Medicare Other | Admitting: General Surgery

## 2013-03-10 VITALS — BP 140/82 | HR 64 | Temp 97.1°F | Resp 18 | Ht 62.0 in | Wt 172.6 lb

## 2013-03-10 DIAGNOSIS — C20 Malignant neoplasm of rectum: Secondary | ICD-10-CM

## 2013-03-10 NOTE — Progress Notes (Signed)
  Radiation Oncology         [phone] ________________________________  Name: Katie [name] MRN: [id number]  Date: 01/27/2013  DOB: [birth year]  Simulation Verification Note   NARRATIVE: The patient was brought to the treatment unit and placed in the planned treatment position. The clinical setup was verified. Then port films were obtained and uploaded to the radiation oncology medical record software.  The treatment beams were carefully compared against the planned radiation fields. The position, location, and shape of the radiation fields was reviewed. The targeted volume of tissue appears to be appropriately covered by the radiation beams. Based on my personal review, I approved the simulation verification. The patient's treatment will proceed as planned.  ________________________________   Radene Gunning, MD, PhD

## 2013-03-10 NOTE — Patient Instructions (Addendum)
Colorectal Cancer  Colorectal cancer is the second most common cancer in the [place], striking 140,000 people annually and causing 60,000 deaths. That's a staggering figure when you consider the disease is potentially curable if diagnosed in the early stages. Who is at risk? Though colorectal cancer may occur at any age, more than 90% of the patients are over age 77, at which point the risk doubles every ten years. In addition to age, other high risk factors include a family history of colorectal cancer and polyps and a personal history of ulcerative colitis, colon polyps or cancer of other organs, especially of the breast or uterus. How does it start? It is generally agreed that nearly all colon and rectal cancer begins in benign polyps. These pre-malignant growths occur on the bowel wall and may eventually increase in size and become cancer. Removal of benign polyps is one aspect of preventive medicine that really works! What are the symptoms? The most common symptoms are rectal bleeding and changes in bowel habits, such as constipation or diarrhea. (These symptoms are also common in other diseases so it is important you receive a thorough examination should you experience them.) Abdominal pain and weight loss are usually late symptoms indicating possible extensive disease. Unfortunately, many polyps and early cancers fail to produce symptoms. Therefore, it is important that your routine physical includes colorectal cancer detection procedures once you reach age 50.  There are several methods for detection of colorectal cancer. These include digital rectal examination, a chemical test of the stool for blood, flexible sigmoidoscopy and colonoscopy (lighted tubular instruments used to inspect the lower bowel) and barium enema. Be sure to discuss these options with your surgeon to determine which procedure is best for you. Individuals who have a first-degree relative (parent or sibling) with colon  cancer or polyps should start their colon cancer screening at the age of 77. How is colorectal cancer treated? Colorectal cancer requires surgery in nearly all cases for complete cure. Radiation and chemotherapy are sometimes used in addition to surgery. Between 80-90% are restored to normal health if the cancer is detected and treated in the earliest stages. The cure rate drops to 50% or less when diagnosed in the later stages. Thanks to modern technology, less than 5% of all colorectal cancer patients require a colostomy, the surgical construction of an artificial excretory opening from the colon. Can colon cancer be prevented? Colon cancer is very preventable. The most important step towards preventing colon cancer is getting a screening test. Any abnormal screening test should be followed by a colonoscopy. Some individuals prefer to start with colonoscopy as a screening test. Colonoscopy provides a detailed examination of the bowel. Polyps can be identified and can often be removed during colonoscopy. Though not definitely proven, there is some evidence that diet may play a significant role in preventing colorectal cancer. As far as we know, a high fiber, low fat diet is the only dietary measure that might help prevent colorectal cancer. Finally, pay attention to changes in your bowel habits. Any new changes such as persistent constipation, diarrhea, or blood in the stool should be discussed with your physician.   Can hemorrhoids lead to colon cancer? No, but hemorrhoids may produce symptoms similar to colon polyps or cancer. Should you experience these symptoms, you should have them examined and evaluated by a physician, preferably by a colon and rectal surgeon. What is a colon and rectal surgeon? Colon and rectal surgeons are experts in the surgical and non-surgical treatment   of diseases of the colon, rectum and anus. They have completed advanced surgical training in the treatment of these  diseases as well as full general surgical training. Board-certified colon and rectal surgeons complete residencies in general surgery and colon and rectal surgery, and pass intensive examinations conducted by the American Board of Surgery and the American Board of Colon and Rectal Surgery. They are well-versed in the treatment of both benign and malignant diseases of the colon, rectum and anus and are able to perform routine screening examinations and surgically treat conditions if indicated to do so.  2012 American Society of Colon & Rectal Surgeons  

## 2013-03-10 NOTE — Progress Notes (Signed)
Chief Complaint  Patient presents with  . New Evaluation    eval rectal ca    HISTORY: Katie [name] is a 79 y.o. female who presents to the office with rectal cancer.  This was found colonoscopy after being admitted for acute blood loss anemia.  Workup thus far has included CT scan which show abd nodes and pulmonary nodules.  She is here today to inquire about surgery options.  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  Past Medical History  Diagnosis Date  . Glaucoma   . Hypertension   . Anxiety   . GERD (gastroesophageal reflux disease)   . Hemorrhoids   . Hyperlipidemia   . Cancer 01/11/13    rectal  . Blood transfusion without reported diagnosis       Past Surgical History  Procedure Laterality Date  . Esophagogastroduodenoscopy N/A 01/10/2013    Procedure: ESOPHAGOGASTRODUODENOSCOPY (EGD);  Surgeon: Rachael Fee, MD;  Location: [facility];  Service: Endoscopy;  Laterality: N/A;  . Colonoscopy N/A 01/11/2013    Procedure: COLONOSCOPY;  Surgeon: Rachael Fee, MD;  Location: [facility];  Service: Endoscopy;  Laterality: N/A;  . Biopsy stomach  01/10/13    mild chronic inflammation, no evidence of malignancy, dysplasia  . Biopsy rectal  01/11/13    proximal mass-invasive adenocarcinoma  . Abdominal hysterectomy  1968    Partial      Current Outpatient Prescriptions  Medication Sig Dispense Refill  . ALPRAZolam (XANAX) 0.5 MG tablet Take 1 tablet (0.5 mg total) by mouth at bedtime as needed for sleep.  30 tablet  0  . benazepril-hydrochlorthiazide (LOTENSIN HCT) 20-25 MG per tablet Take 1 tablet by mouth 2 (two) times daily.       . ferrous sulfate 325 (65 FE) MG tablet Take 1 tablet (325 mg total) by mouth 3 (three) times daily with meals.  90 tablet  1  . latanoprost (XALATAN) 0.005 % ophthalmic solution 1 drop at bedtime.      . polyethylene glycol powder (MIRALAX) powder Take 17 g by mouth daily as needed (for constipation-mix in 8 oz water or  juice).      . sennosides-docusate sodium (SENOKOT-S) 8.6-50 MG tablet Take 1 tablet by mouth 2 (two) times daily.      . verapamil (CALAN-SR) 120 MG CR tablet Take 120 mg by mouth 2 (two) times daily.       No current facility-administered medications for this visit.      No Known Allergies    Family History  Problem Relation Age of Onset  . Cancer Sister     rectal  . Hyperlipidemia Sister   . Hypertension Sister   . Stroke Mother   . Hypertension Father   . Stroke Father   . Cancer Brother     prostate, seed implant      History   Social History  . Marital Status: Widowed    Spouse Name: N/A    Number of Children: N/A  . Years of Education: N/A   Social History Main Topics  . Smoking status: Never Smoker   . Smokeless tobacco: Never Used  . Alcohol Use: No  . Drug Use: No  . Sexually Active: None     Comment: s/p partial hysterectomy   Other Topics Concern  . None   Social History Narrative   Widowed-lives with her grown adopted son (grandson she adopted at 55 months old)   Retired  HS teacher and guidance counselor   Independent in ADL's- drives   Strong family support reported       REVIEW OF SYSTEMS - PERTINENT POSITIVES ONLY: Review of Systems - General ROS: negative for - chills, fever or positive for weight loss Hematological and Lymphatic ROS: negative for - bleeding problems or blood clots Respiratory ROS: no cough, shortness of breath, or wheezing Cardiovascular ROS: no chest pain or dyspnea on exertion Gastrointestinal ROS: positive for - constipation negative for - blood in stools or nausea/vomiting  EXAM: Filed Vitals:   03/10/13 1403  BP: 140/82  Pulse: 64  Temp: 97.1 F (36.2 C)  Resp: 18   Gen:  No acute distress.  Well nourished and well groomed.   Neurological: Alert and oriented to person, place, and time. Coordination normal.  Head: Normocephalic and atraumatic.  Eyes: Conjunctivae are normal. Pupils are equal, round, and  reactive to light. No scleral icterus.  Neck: Normal range of motion. Neck supple. No tracheal deviation or thyromegaly present.  No cervical lymphadenopathy. Cardiovascular: Normal rate, regular rhythm, normal heart sounds and intact distal pulses.  Exam reveals no gallop and no friction rub.  No murmur heard. Respiratory: Effort normal.  No respiratory distress. No chest wall tenderness. Breath sounds normal.  No wheezes, rales or rhonchi.  GI: Soft. Bowel sounds are normal. The abdomen is soft and nontender.  There is no rebound and no guarding.  Musculoskeletal: Normal range of motion. Extremities are nontender.  Skin: Skin is warm and dry. No rash noted. No diaphoresis. No erythema. No pallor. No clubbing, cyanosis, or edema.   Psychiatric: Normal mood and affect. Behavior is normal. Judgment and thought content normal.     LABORATORY RESULTS: Available labs are reviewed  Lab Results  Component Value Date   CEA 138.9* 01/25/2013     RADIOLOGY RESULTS:   Images and reports are reviewed. CT CHEST IMPRESSION:  1. Progressive metastatic disease as evidenced by enlarging left  anterior scalene and mediastinal lymph nodes, enlarging pulmonary  nodules, and an enlarging left hepatic lobe lesion.  2. Gastric mass, better imaged on 01/10/2013. CT AB IMPRESSION:  Eccentric mass near the rectosigmoid junction, compatible with  primary rectal adenocarcinoma.  Associated retroperitoneal/pelvic nodal metastases, as described  above.  11 mm hypoenhancing lesion in the lateral segment left hepatic  lobe, suspicious for metastasis.  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      ASSESSMENT AND PLAN: Katie [name] is a 79 y.o. F with presumed metastatic rectal cancer.  I discussed with her the reasoning for not resecting her tumor.  I think she understands this.  She is very fearful of chemotherapy and  still has a lot of questions about how it is administered.  I have referred her back to Dr Truett Perna and his RN to help answer these questions.  If she decides to proceed with chemo, I will be glad to place a port.    Vanita Panda, MD Colon and Rectal Surgery / General Surgery [facility]      Visit Diagnoses: 1. Rectal cancer     Primary Care Physician: Avon Gully, MD

## 2013-03-13 ENCOUNTER — Telehealth: Payer: Self-pay | Admitting: *Deleted

## 2013-03-13 NOTE — Telephone Encounter (Signed)
Spoke with patient by phone.  She is aware of appointment tomorrow at [facility].  She stated she is afraid of the chemo infusion pump and going home with it.  She asked if there was any other chemo options besides "the pill".  This RN listened and offered emotional support.  This RN suggested she discuss with Dr. Truett Perna tomorrow.  This RN tried to reassure patient that the IV infusion pump was safe and that people generally do very well and have no problems.  She stated "I am afraid to have that needle in me and that pump attached to me for so long".  This RN offered to have SW, Chaplain, or other resources available tomorrow and patient declined.  "I have plenty of family and support", she offered.  I just want to know if the physician can offer something else.  This RN advised to discuss at tomorrow's visit.  Will continue to follow.  Patient expressed appreciation for the call.

## 2013-03-13 NOTE — Telephone Encounter (Signed)
Message from pt requesting return call. Returned call. No answer, no answering machine. Left message with female at mobile # requesting she leave detailed message in case she does not reach RN.

## 2013-03-13 NOTE — Telephone Encounter (Signed)
Call from [place] with GI Oncology at [facility]. Pt is requesting second opinion there. They will need records. Visit is scheduled for 03/16/13. Will forward request to HIM.

## 2013-03-14 ENCOUNTER — Telehealth: Payer: Self-pay | Admitting: Oncology

## 2013-03-14 ENCOUNTER — Ambulatory Visit (HOSPITAL_BASED_OUTPATIENT_CLINIC_OR_DEPARTMENT_OTHER): Payer: Medicare Other | Admitting: Nurse Practitioner

## 2013-03-14 ENCOUNTER — Telehealth (INDEPENDENT_AMBULATORY_CARE_PROVIDER_SITE_OTHER): Payer: Self-pay | Admitting: General Surgery

## 2013-03-14 VITALS — BP 108/70 | HR 74 | Temp 97.0°F | Resp 18 | Ht 62.0 in | Wt 173.4 lb

## 2013-03-14 DIAGNOSIS — C20 Malignant neoplasm of rectum: Secondary | ICD-10-CM

## 2013-03-14 NOTE — Progress Notes (Signed)
OFFICE PROGRESS NOTE  Interval history:  Katie [name] returns for scheduled followup. She has decided against surgery. She would like to proceed with chemotherapy. She prefers the FOLFOX regimen because she has difficulty swallowing pills. She has a good appetite. She denies pain. Bowels overall moving regularly. She intermittently notes "thin" stools. No rectal pain or bleeding.   Objective: Blood pressure 108/70, pulse 74, temperature 97 F (36.1 C), temperature source Oral, resp. rate 18, height 5\' 2"  (1.575 m), weight 173 lb 6.4 oz (78.654 kg).  Oropharynx without thrush or ulceration. Lungs are clear. Regular cardiac rhythm. Abdomen soft and nontender. Extremities without edema. Calves soft and nontender.  Lab Results: Lab Results  Component Value Date   WBC 4.9 02/15/2013   HGB 11.0* 02/15/2013   HCT 33.4* 02/15/2013   MCV 92.5 02/15/2013   PLT 219 02/15/2013    Chemistry:    Chemistry      Component Value Date/Time   NA 141 01/25/2013 0854   NA 143 01/11/2013 0507   K 3.7 01/25/2013 0854   K 3.1* 01/11/2013 0507   CL 104 01/25/2013 0854   CL 109 01/11/2013 0507   CO2 28 01/25/2013 0854   CO2 24 01/11/2013 0507   BUN 16.3 01/25/2013 0854   BUN 7 01/11/2013 0507   CREATININE 0.8 01/25/2013 0854   CREATININE 0.63 01/11/2013 0507      Component Value Date/Time   CALCIUM 9.2 01/25/2013 0854   CALCIUM 8.5 01/11/2013 0507   ALKPHOS 45 01/09/2013 0640   AST 18 01/09/2013 0640   ALT 12 01/09/2013 0640   BILITOT 0.3 01/09/2013 0640       Studies/Results: Ct Chest W Contrast  03/01/2013   *RADIOLOGY REPORT*  Clinical Data: Newly-diagnosed rectal carcinoma with recent radiation therapy.  Follow-up lung nodules.  CT CHEST WITH CONTRAST  Technique:  Multidetector CT imaging of the chest was performed following the standard protocol during bolus administration of intravenous contrast.  Contrast: 80mL OMNIPAQUE IOHEXOL 300 MG/ML  SOLN  Comparison: 01/10/2013, 08/29/2010 and 12/22/2007.  Findings: Left anterior  scalene lymph nodes measure up to 10 mm (previously 8 mm).  Mediastinal lymph nodes have increased in prominence slightly in the interval as well.  Index high right paratracheal lymph node measures 13 mm (previously 7 mm).  A second index mediastinal lymph node in the low left paratracheal station measures 13 mm (previously 11 mm).  Bihilar adenopathy, as before. No axillary adenopathy. An 8 mm retrocrural lymph node (image 40) appears stable.  Atherosclerotic calcification of the arterial vasculature, including coronary arteries.  Heart size normal.  No pericardial effusion.  There are numerous bilateral pulmonary nodules seen in a hematogenous distribution.  Many of these appear minimally more prominent than on 01/10/2013.  Index right lower lobe nodule measures 9 x 9 mm (image 34), previously 7 x 8 mm.  No pleural fluid.  Airway is unremarkable.  Incidental imaging of the upper abdomen shows a stable 7 mm low attenuation lesion in the hepatic dome.  However, a somewhat ill- defined low attenuation lesion in the left hepatic lobe measures 1.4 x 1.7 cm (previously 0.8 x 1.0 cm). A gastric mass is again seen and better evaluated on 01/10/2013.  No worrisome lytic or sclerotic lesions.  IMPRESSION:  1.  Progressive metastatic disease as evidenced by enlarging left anterior scalene and mediastinal lymph nodes, enlarging pulmonary nodules, and an enlarging left hepatic lobe lesion. 2.  Gastric mass, better imaged on 01/10/2013.   Original Report Authenticated  By: Leanna Battles, M.D.   Dg Shoulder Left  03/08/2013   *RADIOLOGY REPORT*  Clinical Data: Pain in the left shoulder, no injury  LEFT SHOULDER - 2+ VIEW  Comparison: None  Findings: There is moderate degenerative joint disease of the glenohumeral joint with spurring from the left humeral head and the glenoid rim.  In addition, there is degenerative change at the left [facility] joint with some spurring which may result in impingement.  No acute abnormality is seen.   IMPRESSION: Moderate degenerative joint disease of left shoulder with acromial spurring which may indicate impingement.   Original Report Authenticated By: Dwyane Dee, M.D.    Medications: I have reviewed the patient's current medications.  Assessment/Plan: 1. 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 She completed a course of palliative radiation 01/30/2013 through 02/17/2013. She declined Xeloda and continuous infusion 5 fluorouracil. 2. Submucosal gastric mass identified in 2011. Slightly larger on CT 01/10/2013. 3. Staging CT scans 01/11/2013 consistent with a primary rectal tumor and metastatic abdominal/pelvic lymph nodes, chest lymph nodes and bilateral pulmonary nodules. Restaging chest CT 03/01/2013 showed progressive metastatic disease involving an enlarging left anterior scalene lymph node and mediastinal lymph nodes, enlarging pulmonary nodules and an enlarging left hepatic lobe lesion. 4. Gastrointestinal bleeding. Question related to the submucosal gastric mass versus the rectal tumor. 5. Severe anemia 01/09/2013. Status post packed red blood cell transfusions, now maintained on iron. The anemia has improved.  Disposition-Ms. Panameno appears stable. She has decided to proceed with systemic therapy on the FOLFOX regimen. We reviewed potential toxicities including myelosuppression, nausea, an allergic reaction. We discussed potential toxicities associated with 5-fluorouracil including mouth sores, nausea, diarrhea, hand-foot syndrome, skin hyperpigmentation, conjunctivitis. We discussed the potential for neurotoxicity with oxaliplatin. She will attend a chemotherapy education class. She will need a Port-A-Cath placed. We made a referral to Dr. Maisie Fus for the Port-A-Cath. Of note, Ms. Kosier has an appointment at [facility] later this week for a second opinion. She would like for [place] to go ahead and schedule cycle 1 FOLFOX to begin on 03/27/2013. We will see her in  followup prior to cycle 2 on 04/10/2013. She will contact the office in the interim with any problems.  Plan reviewed with Dr. Truett Perna.  Lonna Cobb ANP/GNP-BC

## 2013-03-14 NOTE — Telephone Encounter (Signed)
Faxed pt medical records to Dr. Olga Millers @ [facility]

## 2013-03-14 NOTE — Telephone Encounter (Signed)
Gave pt appt for June and July lab, MD, chemo class emailed Marcelino Duster regarding chemo appt on 03/27/13. [facility] surgery  pt will be called regarding  portacath placement , nurse aware that pt first day of chemo is on 03/27/13

## 2013-03-14 NOTE — Telephone Encounter (Signed)
Rose from [facility] called to let us know patient will be starting Chemotherapy on 03/27/2013.

## 2013-03-15 ENCOUNTER — Emergency Department (HOSPITAL_COMMUNITY)
Admission: EM | Admit: 2013-03-15 | Discharge: 2013-03-15 | Disposition: A | Discharge status: Home / Self Care | Payer: Medicare Other | Attending: Emergency Medicine | Admitting: Emergency Medicine

## 2013-03-15 ENCOUNTER — Encounter (HOSPITAL_COMMUNITY): Payer: Self-pay | Admitting: Emergency Medicine

## 2013-03-15 ENCOUNTER — Encounter (HOSPITAL_BASED_OUTPATIENT_CLINIC_OR_DEPARTMENT_OTHER): Payer: Self-pay | Admitting: *Deleted

## 2013-03-15 ENCOUNTER — Ambulatory Visit (INDEPENDENT_AMBULATORY_CARE_PROVIDER_SITE_OTHER): Payer: Medicare Other | Admitting: Surgery

## 2013-03-15 ENCOUNTER — Telehealth: Payer: Self-pay | Admitting: *Deleted

## 2013-03-15 DIAGNOSIS — F411 Generalized anxiety disorder: Secondary | ICD-10-CM | POA: Insufficient documentation

## 2013-03-15 DIAGNOSIS — R143 Flatulence: Secondary | ICD-10-CM | POA: Insufficient documentation

## 2013-03-15 DIAGNOSIS — K644 Residual hemorrhoidal skin tags: Secondary | ICD-10-CM | POA: Insufficient documentation

## 2013-03-15 DIAGNOSIS — R142 Eructation: Secondary | ICD-10-CM | POA: Insufficient documentation

## 2013-03-15 DIAGNOSIS — I10 Essential (primary) hypertension: Secondary | ICD-10-CM | POA: Insufficient documentation

## 2013-03-15 DIAGNOSIS — C20 Malignant neoplasm of rectum: Secondary | ICD-10-CM

## 2013-03-15 DIAGNOSIS — K219 Gastro-esophageal reflux disease without esophagitis: Secondary | ICD-10-CM | POA: Insufficient documentation

## 2013-03-15 DIAGNOSIS — K649 Unspecified hemorrhoids: Secondary | ICD-10-CM

## 2013-03-15 DIAGNOSIS — Z79899 Other long term (current) drug therapy: Secondary | ICD-10-CM | POA: Insufficient documentation

## 2013-03-15 DIAGNOSIS — K625 Hemorrhage of anus and rectum: Secondary | ICD-10-CM

## 2013-03-15 DIAGNOSIS — H409 Unspecified glaucoma: Secondary | ICD-10-CM | POA: Insufficient documentation

## 2013-03-15 DIAGNOSIS — E785 Hyperlipidemia, unspecified: Secondary | ICD-10-CM | POA: Insufficient documentation

## 2013-03-15 DIAGNOSIS — D649 Anemia, unspecified: Secondary | ICD-10-CM | POA: Insufficient documentation

## 2013-03-15 DIAGNOSIS — R141 Gas pain: Secondary | ICD-10-CM | POA: Insufficient documentation

## 2013-03-15 LAB — CBC
HCT: 34.6 % — ABNORMAL LOW (ref 36.0–46.0)
Hemoglobin: 11.8 g/dL — ABNORMAL LOW (ref 12.0–15.0)
RBC: 3.81 MIL/uL — ABNORMAL LOW (ref 3.87–5.11)
WBC: 7.2 10*3/uL (ref 4.0–10.5)

## 2013-03-15 LAB — COMPREHENSIVE METABOLIC PANEL
ALT: 11 U/L (ref 0–35)
Alkaline Phosphatase: 60 U/L (ref 39–117)
BUN: 11 mg/dL (ref 6–23)
CO2: 25 mEq/L (ref 19–32)
Chloride: 100 mEq/L (ref 96–112)
GFR calc Af Amer: >90 mL/min (ref >90)
Glucose, Bld: 104 mg/dL — ABNORMAL HIGH (ref 70–99)
Potassium: 3.4 mEq/L — ABNORMAL LOW (ref 3.5–5.1)
Total Bilirubin: 0.2 mg/dL — ABNORMAL LOW (ref 0.3–1.2)

## 2013-03-15 LAB — PROTIME-INR: INR: 0.97 (ref 0.00–1.49)

## 2013-03-15 LAB — TYPE AND SCREEN: ABO/RH(D): A POS

## 2013-03-15 NOTE — ED Notes (Signed)
Pt woke up this morning and used the bathroom.  States that there was "a little bit of blood" that dripped in the toilet.  States that it was pink/red on the paper.  Pt has rectal cancer.

## 2013-03-15 NOTE — Progress Notes (Signed)
To come in for bmet-6/13 No cardiac problems-

## 2013-03-15 NOTE — Telephone Encounter (Signed)
Per staff message and POF I have scheduled appts.  JMW  

## 2013-03-15 NOTE — ED Provider Notes (Signed)
History     CSN: [id number]  Arrival date & time 03/15/13  1643   First MD Initiated Contact with Patient 03/15/13 1659      Chief Complaint  Patient presents with  . Rectal Bleeding  . Rectal Cancer    (Consider location/radiation/quality/duration/timing/severity/associated sxs/prior treatment) HPI Pt is a 79yo female with hx of rectal cancer, anemia and hemorrhoids presenting today after she noticed "a little bit of blood" that was on toilet paper and  In toilet x2 earlier today after BM.  Pt states she had some mild lower abdominal discomfort earlier today that felt like gas, relieved by passing gas and having a BM.  Pt states she feels well at this time but was concerned about the blood due to hx of rectal cancer and anemia.  Reports having blood transfusion in April. Also reports recent completion of chemo.  Denies fever, n/v/d.  Denies dysuria.   PCP: none Oncologist: Dr. Romie Levee   Past Medical History  Diagnosis Date  . Glaucoma   . Hypertension   . Anxiety   . GERD (gastroesophageal reflux disease)   . Hemorrhoids   . Hyperlipidemia   . Cancer 01/11/13    rectal  . Blood transfusion without reported diagnosis     Past Surgical History  Procedure Laterality Date  . Esophagogastroduodenoscopy N/A 01/10/2013    Procedure: ESOPHAGOGASTRODUODENOSCOPY (EGD);  Surgeon: Rachael Fee, MD;  Location: [facility];  Service: Endoscopy;  Laterality: N/A;  . Colonoscopy N/A 01/11/2013    Procedure: COLONOSCOPY;  Surgeon: Rachael Fee, MD;  Location: [facility];  Service: Endoscopy;  Laterality: N/A;  . Biopsy stomach  01/10/13    mild chronic inflammation, no evidence of malignancy, dysplasia  . Biopsy rectal  01/11/13    proximal mass-invasive adenocarcinoma  . Abdominal hysterectomy  1968    Partial    Family History  Problem Relation Age of Onset  . Cancer Sister     rectal  . Hyperlipidemia Sister   . Hypertension Sister   . Stroke Mother   . Hypertension  Father   . Stroke Father   . Cancer Brother     prostate, seed implant    History  Substance Use Topics  . Smoking status: Never Smoker   . Smokeless tobacco: Never Used  . Alcohol Use: No    OB History   Grav Para Term Preterm Abortions TAB SAB Ect Mult Living                  Review of Systems  Constitutional: Negative for fever and chills.  Respiratory: Negative for shortness of breath.   Cardiovascular: Negative for chest pain.  Gastrointestinal: Positive for abdominal pain, blood in stool and anal bleeding. Negative for nausea, vomiting, diarrhea, constipation and rectal pain.  Genitourinary: Negative for dysuria, hematuria, flank pain and pelvic pain.  All other systems reviewed and are negative.    Allergies  Review of patient's allergies indicates no known allergies.  Home Medications   Current Outpatient Rx  Name  Route  Sig  Dispense  Refill  . ALPRAZolam (XANAX) 0.5 MG tablet   Oral   Take 1 tablet (0.5 mg total) by mouth at bedtime as needed for sleep.   30 tablet   0   . benazepril-hydrochlorthiazide (LOTENSIN HCT) 20-25 MG per tablet   Oral   Take 1 tablet by mouth 2 (two) times daily.          . ferrous sulfate 325 (65  FE) MG tablet   Oral   Take 325 mg by mouth 2 (two) times daily.         Marland Kitchen latanoprost (XALATAN) 0.005 % ophthalmic solution   Both Eyes   Place 1 drop into both eyes at bedtime.          . sennosides-docusate sodium (SENOKOT-S) 8.6-50 MG tablet   Oral   Take 1 tablet by mouth 2 (two) times daily as needed for constipation.          . verapamil (CALAN-SR) 120 MG CR tablet   Oral   Take 120 mg by mouth 2 (two) times daily.           BP 128/64  Pulse 74  Temp(Src) 98.2 F (36.8 C) (Oral)  Resp 16  SpO2 100%  Physical Exam  Nursing note and vitals reviewed. Constitutional: She appears well-developed and well-nourished. No distress.  Pt sitting up on exam bed, rummaging through purse, NAD. Appears well.    HENT:  Head: Normocephalic and atraumatic.  Eyes: Conjunctivae are normal. No scleral icterus.  Neck: Normal range of motion. Neck supple.  Cardiovascular: Normal rate, regular rhythm and normal heart sounds.   Pulmonary/Chest: Effort normal and breath sounds normal. No respiratory distress. She has no wheezes. She has no rales. She exhibits no tenderness.  Abdominal: Soft. Bowel sounds are normal. She exhibits no distension and no mass. There is no tenderness. There is no rebound and no guarding.  Genitourinary: Rectal exam shows external hemorrhoid. Rectal exam shows no internal hemorrhoid, no fissure, no mass, no tenderness and anal tone normal. Guaiac positive stool.  Musculoskeletal: Normal range of motion.  Neurological: She is alert.  Skin: Skin is warm and dry. She is not diaphoretic.    ED Course  Procedures (including critical care time)  Labs Reviewed  CBC - Abnormal; Notable for the following:    RBC 3.81 (*)    Hemoglobin 11.8 (*)    HCT 34.6 (*)    All other components within normal limits  COMPREHENSIVE METABOLIC PANEL - Abnormal; Notable for the following:    Sodium 134 (*)    Potassium 3.4 (*)    Glucose, Bld 104 (*)    Albumin 3.1 (*)    Total Bilirubin 0.2 (*)    GFR calc non Af Amer 83 (*)    All other components within normal limits  OCCULT BLOOD, POC DEVICE - Abnormal; Notable for the following:    Fecal Occult Bld POSITIVE (*)    All other components within normal limits  PROTIME-INR  OCCULT BLOOD X 1 CARD TO LAB, STOOL  TYPE AND SCREEN   No results found.   1. Rectal bleed   2. Rectal cancer   3. Hemorrhoids       MDM  Pt with hx of rectal cancer, anemia, and hemorrhoids concerned about "little bit of blood" she noticed x2 earlier today.  Reports blood transfusion in April because of anemia.  Denies any symptoms at this time.  Will get blood work on pt to ensure pt does not need repeat transfusion.  CBC: unremarkable, Hgb/Hct actually  increased since last check 40mo ago.  CMP: unremarkable. Hemoccult: positive  Discussed labs with pt, letting her know blood has improved since last check 40mo ago.  Explained to pt, blood could be coming from hemorrhoids.  Advised pt to f/u with PCP, Dr. Felecia Shelling, and Oncology, Dr. Maisie Fus for scheduled appointment on 6/16.  Return precautions given. Pt verbalized understanding and  agreement with tx plan. Vitals: unremarkable. Discharged in stable condition.    Discussed pt with attending during ED encounter.         Junius Finner, PA-C 03/15/13 1840

## 2013-03-16 ENCOUNTER — Telehealth: Payer: Self-pay | Admitting: *Deleted

## 2013-03-16 NOTE — Telephone Encounter (Signed)
Attempted to call back at 1453-female answering phone said she has stepped out and requested I call back in an hour or so.

## 2013-03-16 NOTE — ED Provider Notes (Signed)
Medical screening examination/treatment/procedure(s) were conducted as a shared visit with non-physician practitioner(s) and myself.  I personally evaluated the patient during the encounter  Toy Baker, MD 03/16/13 1312

## 2013-03-16 NOTE — Telephone Encounter (Signed)
Wanted MD aware she was in emergency room yesterday for rectal bleeding. Still having a little. Asking if there is anything else to be done?

## 2013-03-17 ENCOUNTER — Telehealth (INDEPENDENT_AMBULATORY_CARE_PROVIDER_SITE_OTHER): Payer: Self-pay | Admitting: General Surgery

## 2013-03-17 NOTE — Telephone Encounter (Signed)
Nurse at [facility] called to report the pt is cancelling her PAC surgery.  She reported the pt stated she is seeking to find a Careers adviser who will operate on her to remove the cancer instead.

## 2013-03-17 NOTE — Progress Notes (Signed)
Pt left a message this am that she is cancelling the PAC-cancelling everything-looking for someone to do surgery. I called [facility] nursing office to let scheduling know-they probably need to talk to her.

## 2013-03-17 NOTE — Telephone Encounter (Signed)
That's fine

## 2013-03-20 ENCOUNTER — Ambulatory Visit (HOSPITAL_BASED_OUTPATIENT_CLINIC_OR_DEPARTMENT_OTHER): Admission: RE | Admit: 2013-03-20 | Payer: Medicare Other | Source: Ambulatory Visit | Admitting: General Surgery

## 2013-03-20 ENCOUNTER — Other Ambulatory Visit: Payer: Medicare Other

## 2013-03-20 SURGERY — INSERTION, TUNNELED CENTRAL VENOUS DEVICE, WITH PORT
Anesthesia: General

## 2013-03-21 ENCOUNTER — Telehealth: Payer: Self-pay | Admitting: Oncology

## 2013-03-21 NOTE — Telephone Encounter (Signed)
Talked to pt and she wants to r/s chemo 6/23 , she will call nurse to notify MD

## 2013-03-22 ENCOUNTER — Encounter: Payer: Self-pay | Admitting: Oncology

## 2013-03-23 ENCOUNTER — Ambulatory Visit (INDEPENDENT_AMBULATORY_CARE_PROVIDER_SITE_OTHER): Payer: Medicare Other | Admitting: General Surgery

## 2013-03-23 ENCOUNTER — Ambulatory Visit: Payer: Medicare Other | Admitting: Radiation Oncology

## 2013-03-24 ENCOUNTER — Telehealth: Payer: Self-pay | Admitting: *Deleted

## 2013-03-24 NOTE — Telephone Encounter (Signed)
VM from patient to cancel all appointments. She will not be receiving treatment here any longer. Called patient to follow up and she reports she has decided to transfer her care to [facility]. She wants to go to a larger, more experienced cancer center. Made her aware that we just want to be sure she receives cancer care. Instructed her let us know if we can be of any assistance to her in the future. She understands and thanks nurse for her call.

## 2013-03-27 ENCOUNTER — Inpatient Hospital Stay: Payer: Medicare Other

## 2013-03-29 ENCOUNTER — Telehealth: Payer: Self-pay | Admitting: *Deleted

## 2013-03-29 NOTE — Telephone Encounter (Signed)
Spoke with patient to reschedule a missed follow-up appointment with Dr. Mitzi Hansen.  She says she has been accepted into a program at [facility] and will get treatment there.  Notified Dr. Mitzi Hansen & nursing.

## 2013-04-10 ENCOUNTER — Other Ambulatory Visit: Payer: Medicare Other | Admitting: Lab

## 2013-04-10 ENCOUNTER — Ambulatory Visit: Payer: Medicare Other | Admitting: Nurse Practitioner

## 2013-04-10 ENCOUNTER — Inpatient Hospital Stay: Payer: Medicare Other

## 2013-04-24 ENCOUNTER — Ambulatory Visit: Payer: Medicare Other | Admitting: Oncology

## 2013-04-24 ENCOUNTER — Inpatient Hospital Stay: Payer: Medicare Other

## 2013-04-24 ENCOUNTER — Other Ambulatory Visit: Payer: Medicare Other | Admitting: Lab

## 2013-08-17 ENCOUNTER — Telehealth: Payer: Self-pay | Admitting: Radiation Oncology

## 2013-08-17 NOTE — Telephone Encounter (Signed)
Mailed records to [facility], including color dosimetry.  OK per [facility]SM.

## 2013-10-09 ENCOUNTER — Inpatient Hospital Stay (HOSPITAL_COMMUNITY)
Admission: EM | Admit: 2013-10-09 | Discharge: 2013-10-12 | DRG: 054 | Disposition: A | Discharge status: Home / Self Care | Payer: Medicare Other | Attending: Internal Medicine | Admitting: Internal Medicine

## 2013-10-09 ENCOUNTER — Inpatient Hospital Stay (HOSPITAL_COMMUNITY): Payer: Medicare Other

## 2013-10-09 ENCOUNTER — Emergency Department (HOSPITAL_COMMUNITY): Payer: Medicare Other

## 2013-10-09 ENCOUNTER — Encounter (HOSPITAL_COMMUNITY): Payer: Self-pay | Admitting: Emergency Medicine

## 2013-10-09 DIAGNOSIS — C801 Malignant (primary) neoplasm, unspecified: Secondary | ICD-10-CM

## 2013-10-09 DIAGNOSIS — D72819 Decreased white blood cell count, unspecified: Secondary | ICD-10-CM | POA: Diagnosis present

## 2013-10-09 DIAGNOSIS — C7949 Secondary malignant neoplasm of other parts of nervous system: Principal | ICD-10-CM

## 2013-10-09 DIAGNOSIS — Z923 Personal history of irradiation: Secondary | ICD-10-CM

## 2013-10-09 DIAGNOSIS — H409 Unspecified glaucoma: Secondary | ICD-10-CM | POA: Diagnosis present

## 2013-10-09 DIAGNOSIS — C20 Malignant neoplasm of rectum: Secondary | ICD-10-CM

## 2013-10-09 DIAGNOSIS — C7952 Secondary malignant neoplasm of bone marrow: Secondary | ICD-10-CM

## 2013-10-09 DIAGNOSIS — C7931 Secondary malignant neoplasm of brain: Principal | ICD-10-CM

## 2013-10-09 DIAGNOSIS — R531 Weakness: Secondary | ICD-10-CM | POA: Diagnosis present

## 2013-10-09 DIAGNOSIS — I10 Essential (primary) hypertension: Secondary | ICD-10-CM | POA: Diagnosis present

## 2013-10-09 DIAGNOSIS — D649 Anemia, unspecified: Secondary | ICD-10-CM

## 2013-10-09 DIAGNOSIS — R911 Solitary pulmonary nodule: Secondary | ICD-10-CM | POA: Diagnosis present

## 2013-10-09 DIAGNOSIS — M6281 Muscle weakness (generalized): Secondary | ICD-10-CM

## 2013-10-09 DIAGNOSIS — M25519 Pain in unspecified shoulder: Secondary | ICD-10-CM | POA: Diagnosis present

## 2013-10-09 DIAGNOSIS — R269 Unspecified abnormalities of gait and mobility: Secondary | ICD-10-CM

## 2013-10-09 DIAGNOSIS — C7951 Secondary malignant neoplasm of bone: Secondary | ICD-10-CM | POA: Diagnosis present

## 2013-10-09 DIAGNOSIS — F411 Generalized anxiety disorder: Secondary | ICD-10-CM | POA: Diagnosis present

## 2013-10-09 DIAGNOSIS — E785 Hyperlipidemia, unspecified: Secondary | ICD-10-CM | POA: Diagnosis present

## 2013-10-09 DIAGNOSIS — G936 Cerebral edema: Secondary | ICD-10-CM | POA: Diagnosis present

## 2013-10-09 DIAGNOSIS — G8929 Other chronic pain: Secondary | ICD-10-CM | POA: Diagnosis present

## 2013-10-09 DIAGNOSIS — K921 Melena: Secondary | ICD-10-CM

## 2013-10-09 DIAGNOSIS — K219 Gastro-esophageal reflux disease without esophagitis: Secondary | ICD-10-CM | POA: Diagnosis present

## 2013-10-09 LAB — CBC
HCT: 36.7 % (ref 36.0–46.0)
HEMOGLOBIN: 12.8 g/dL (ref 12.0–15.0)
MCH: 33.9 pg (ref 26.0–34.0)
MCHC: 34.9 g/dL (ref 30.0–36.0)
MCV: 97.1 fL (ref 78.0–100.0)
Platelets: 182 10*3/uL (ref 150–400)
RBC: 3.78 MIL/uL — ABNORMAL LOW (ref 3.87–5.11)
RDW: 13.8 % (ref 11.5–15.5)
WBC: 3.5 10*3/uL — ABNORMAL LOW (ref 4.0–10.5)

## 2013-10-09 LAB — COMPREHENSIVE METABOLIC PANEL
ALBUMIN: 3.5 g/dL (ref 3.5–5.2)
ALK PHOS: 68 U/L (ref 39–117)
ALT: 12 U/L (ref 0–35)
AST: 21 U/L (ref 0–37)
BUN: 16 mg/dL (ref 6–23)
CALCIUM: 9 mg/dL (ref 8.4–10.5)
CO2: 19 mEq/L (ref 19–32)
Chloride: 101 mEq/L (ref 96–112)
Creatinine, Ser: 0.72 mg/dL (ref 0.50–1.10)
GFR calc Af Amer: >90 mL/min (ref >90)
GFR calc non Af Amer: 81 mL/min — ABNORMAL LOW (ref >90)
Glucose, Bld: 130 mg/dL — ABNORMAL HIGH (ref 70–99)
POTASSIUM: 4.1 meq/L (ref 3.7–5.3)
SODIUM: 137 meq/L (ref 137–147)
TOTAL PROTEIN: 7 g/dL (ref 6.0–8.3)
Total Bilirubin: 0.2 mg/dL — ABNORMAL LOW (ref 0.3–1.2)

## 2013-10-09 LAB — PROTIME-INR
INR: 0.96 (ref 0.00–1.49)
PROTHROMBIN TIME: 12.6 s (ref 11.6–15.2)

## 2013-10-09 LAB — DIFFERENTIAL
BASOS ABS: 0 10*3/uL (ref 0.0–0.1)
BASOS PCT: 0 % (ref 0–1)
EOS ABS: 0.1 10*3/uL (ref 0.0–0.7)
Eosinophils Relative: 3 % (ref 0–5)
Lymphocytes Relative: 13 % (ref 12–46)
Lymphs Abs: 0.5 10*3/uL — ABNORMAL LOW (ref 0.7–4.0)
MONOS PCT: 10 % (ref 3–12)
Monocytes Absolute: 0.3 10*3/uL (ref 0.1–1.0)
NEUTROS PCT: 74 % (ref 43–77)
Neutro Abs: 2.6 10*3/uL (ref 1.7–7.7)

## 2013-10-09 LAB — POCT I-STAT TROPONIN I: Troponin i, poc: 0 ng/mL (ref 0.00–0.08)

## 2013-10-09 LAB — TROPONIN I: Troponin I: <0.3 ng/mL (ref <0.30)

## 2013-10-09 LAB — APTT: aPTT: 27 seconds (ref 24–37)

## 2013-10-09 MED ORDER — SULFAMETHOXAZOLE-TMP DS 800-160 MG PO TABS
1.0000 | ORAL_TABLET | Freq: Two times a day (BID) | ORAL | Status: DC
  Administered 2013-10-09 – 2013-10-12 (×6): 1 via ORAL
  Filled 2013-10-09 (×7): qty 1

## 2013-10-09 MED ORDER — GADOBENATE DIMEGLUMINE 529 MG/ML IV SOLN
15.0000 mL | Freq: Once | INTRAVENOUS | Status: AC
  Administered 2013-10-09: 15 mL via INTRAVENOUS

## 2013-10-09 MED ORDER — BENAZEPRIL-HYDROCHLOROTHIAZIDE 20-25 MG PO TABS: 1.0000 | ORAL_TABLET | Freq: Two times a day (BID) | ORAL | Status: DC

## 2013-10-09 MED ORDER — VERAPAMIL HCL ER 120 MG PO TBCR
120.0000 mg | EXTENDED_RELEASE_TABLET | Freq: Two times a day (BID) | ORAL | Status: DC
  Administered 2013-10-09 – 2013-10-12 (×6): 120 mg via ORAL
  Filled 2013-10-09 (×7): qty 1

## 2013-10-09 MED ORDER — FERROUS SULFATE 325 (65 FE) MG PO TABS
325.0000 mg | ORAL_TABLET | Freq: Two times a day (BID) | ORAL | Status: DC
  Administered 2013-10-09 – 2013-10-12 (×6): 325 mg via ORAL
  Filled 2013-10-09 (×7): qty 1

## 2013-10-09 MED ORDER — DEXAMETHASONE SODIUM PHOSPHATE 10 MG/ML IJ SOLN
10.0000 mg | Freq: Once | INTRAMUSCULAR | Status: AC
  Administered 2013-10-09: 10 mg via INTRAVENOUS
  Filled 2013-10-09: qty 1

## 2013-10-09 MED ORDER — HYDROCHLOROTHIAZIDE 25 MG PO TABS
25.0000 mg | ORAL_TABLET | Freq: Every day | ORAL | Status: DC
  Administered 2013-10-10 – 2013-10-12 (×3): 25 mg via ORAL
  Filled 2013-10-09 (×3): qty 1

## 2013-10-09 MED ORDER — ALPRAZOLAM 0.5 MG PO TABS
0.5000 mg | ORAL_TABLET | Freq: Every evening | ORAL | Status: DC | PRN
  Administered 2013-10-09 – 2013-10-11 (×3): 0.5 mg via ORAL
  Filled 2013-10-09 (×3): qty 1

## 2013-10-09 MED ORDER — ENOXAPARIN SODIUM 40 MG/0.4ML ~~LOC~~ SOLN
40.0000 mg | SUBCUTANEOUS | Status: DC
  Filled 2013-10-09 (×3): qty 0.4

## 2013-10-09 MED ORDER — LATANOPROST 0.005 % OP SOLN
1.0000 [drp] | Freq: Every day | OPHTHALMIC | Status: DC
  Administered 2013-10-09 – 2013-10-11 (×3): 1 [drp] via OPHTHALMIC
  Filled 2013-10-09: qty 2.5

## 2013-10-09 MED ORDER — DEXAMETHASONE SODIUM PHOSPHATE 4 MG/ML IJ SOLN
4.0000 mg | Freq: Four times a day (QID) | INTRAMUSCULAR | Status: DC
  Administered 2013-10-09 – 2013-10-12 (×11): 4 mg via INTRAVENOUS
  Filled 2013-10-09 (×17): qty 1

## 2013-10-09 MED ORDER — BENAZEPRIL HCL 20 MG PO TABS
20.0000 mg | ORAL_TABLET | Freq: Every day | ORAL | Status: DC
  Administered 2013-10-10 – 2013-10-12 (×3): 20 mg via ORAL
  Filled 2013-10-09 (×3): qty 1

## 2013-10-09 MED ORDER — PANTOPRAZOLE SODIUM 40 MG PO TBEC
40.0000 mg | DELAYED_RELEASE_TABLET | Freq: Every day | ORAL | Status: DC
  Administered 2013-10-10 – 2013-10-12 (×3): 40 mg via ORAL
  Filled 2013-10-09 (×3): qty 1

## 2013-10-09 MED ORDER — VALACYCLOVIR HCL 500 MG PO TABS
1000.0000 mg | ORAL_TABLET | Freq: Three times a day (TID) | ORAL | Status: DC
  Administered 2013-10-09 – 2013-10-12 (×9): 1000 mg via ORAL
  Filled 2013-10-09 (×10): qty 2

## 2013-10-09 NOTE — ED Notes (Signed)
Report call to Ernest, pt to be transported

## 2013-10-09 NOTE — ED Notes (Addendum)
Called report:   

## 2013-10-09 NOTE — ED Notes (Signed)
Pt c/o left arm and leg weakness with grip strength weakness and gait difference x 2 days; pt sts hx of radiation to left shoulder with hx of cancer

## 2013-10-09 NOTE — H&P (Signed)
[facility] History and Physical  Katie [name] JQB:[id number] DOB: [birth year] DOA: 10/09/2013  Referring physician: ED physician PCP: Rosita Fire, MD   Chief Complaint: left sided weakness   HPI:  80 y.o. female with a past medical history significant for HTN, hyperlipidemia, rectal cancer s/p radiation, anxiety, comes in for evaluation of the above stated symptoms.  She said that since yesterday she has been noticing some trouble walking, but today she had difficulty picking up her left leg form the floor and noticed that she was staggering. Never had similar symptoms before.  No similar symptoms in her left UE. No HA, vertigo, double vision, difficulty swallowing, focal numbness, slurred speech, language or vision impairment.  CT brain without contrast showed a " 1.1 cm hyperdense focus in the right parietal white matter with surrounding vasogenic edema. This could represent a hyperdense lesion or small hemorrhage. There is also concern for  vasogenic edema and a hyperdense lesion near the left vertex and focal edema in the left temporal lobe".   Assessment and Plan:  Principal Problem:   Left-sided weakness - unclear etiology at this time, MRI brain w/wo contrast needed for further evaluation, ? Brain mets vs hemorrhage vs CVA - will proceed with admission to telemetry bed and will place order for 2 D ECHO and carotid doppler - will also order lipid panel and A1C - PT/OT/SLP evaluation and advance diet as pt able to tolerate - appreciate neurology consult  Active Problems:   Metastasis to brain - MRI brain w/wo contrast  - will ask primary team to call oncology in AM - continue Decadron IV    Rectal cancer - oncology consult    Leukocytopenia, unspecified - chronic and secondary to malignancy - appears to be at baseline  - CBC in AM    Solitary pulmonary nodule - will obtain CXR for evaluation  - may even need CT chest but will ask primary team to decide on further  evaluation    HTN - continue home medical regimen   Code Status: Full Family Communication: Pt at bedside Disposition Plan: Admit to telemetry bed   Review of Systems:  Constitutional: Negative for diaphoresis.  HENT: Negative for hearing loss, ear pain, nosebleeds, congestion, sore throat, neck pain, tinnitus and ear discharge.   Eyes: Negative for blurred vision, double vision, photophobia, pain, discharge and redness.  Respiratory: Negative for cough, hemoptysis, sputum production, shortness of breath, wheezing and stridor.   Cardiovascular: Negative for chest pain, palpitations, orthopnea, claudication and leg swelling.  Gastrointestinal: Negative for heartburn, constipation, blood in stool and melena.  Genitourinary: Negative for dysuria, urgency, frequency, hematuria and flank pain.  Musculoskeletal: Negative for myalgias, back pain.  Skin: Negative for itching and rash.  Neurological: Per HPI  Endo/Heme/Allergies: Negative for environmental allergies and polydipsia. Does not bruise/bleed easily.  Psychiatric/Behavioral: Negative for suicidal ideas. The patient is not nervous/anxious.      Past Medical History  Diagnosis Date  . Glaucoma   . Hypertension   . Anxiety   . GERD (gastroesophageal reflux disease)   . Hemorrhoids   . Hyperlipidemia   . Cancer 01/11/13    rectal  . Blood transfusion without reported diagnosis   . History of radiation therapy 01/30/2013-02/17/2013    37.5 gray to the pelvic region    Past Surgical History  Procedure Laterality Date  . Esophagogastroduodenoscopy N/A 01/10/2013    Procedure: ESOPHAGOGASTRODUODENOSCOPY (EGD);  Surgeon: Milus Banister, MD;  Location: [facility];  Service: Endoscopy;  Laterality:  N/A;  . Colonoscopy N/A 01/11/2013    Procedure: COLONOSCOPY;  Surgeon: Milus Banister, MD;  Location: [facility];  Service: Endoscopy;  Laterality: N/A;  . Biopsy stomach  01/10/13    mild chronic inflammation, no evidence of malignancy,  dysplasia  . Biopsy rectal  01/11/13    proximal mass-invasive adenocarcinoma  . Abdominal hysterectomy  1968    Partial    Social History:  reports that she has never smoked. She has never used smokeless tobacco. She reports that she does not drink alcohol or use illicit drugs.  No Known Allergies  Family History  Problem Relation Age of Onset  . Cancer Sister     rectal  . Hyperlipidemia Sister   . Hypertension Sister   . Stroke Mother   . Hypertension Father   . Stroke Father   . Cancer Brother     prostate, seed implant    Prior to Admission medications   Medication Sig Start Date End Date Taking? Authorizing Provider  ALPRAZolam Duanne Moron) 0.5 MG tablet Take 1 tablet (0.5 mg total) by mouth at bedtime as needed for sleep. 02/22/13  Yes Ladell Pier, MD  benazepril-hydrochlorthiazide (LOTENSIN HCT) 20-25 MG per tablet Take 1 tablet by mouth 2 (two) times daily.    Yes Historical Provider, MD  ferrous sulfate 325 (65 FE) MG tablet Take 325 mg by mouth 2 (two) times daily.   Yes Historical Provider, MD  latanoprost (XALATAN) 0.005 % ophthalmic solution Place 1 drop into both eyes at bedtime.    Yes Historical Provider, MD  pantoprazole (PROTONIX) 40 MG tablet Take 40 mg by mouth daily.   Yes Historical Provider, MD  sulfamethoxazole-trimethoprim (BACTRIM DS,SEPTRA DS) 800-160 MG per tablet Take 1 tablet by mouth 2 (two) times daily.   Yes Historical Provider, MD  valACYclovir (VALTREX) 1000 MG tablet Take 1,000 mg by mouth 3 (three) times daily.   Yes Historical Provider, MD  verapamil (CALAN-SR) 120 MG CR tablet Take 120 mg by mouth 2 (two) times daily.   Yes Historical Provider, MD    Physical Exam: Filed Vitals:   10/09/13 1629 10/09/13 1825  BP: 150/79 135/83  Pulse: 108 85  Temp: 98.1 F (36.7 C)   TempSrc: Oral   Resp: 18 18  SpO2: 100% 99%    Physical Exam  Constitutional: Appears well-developed and well-nourished. No distress.  HENT: Normocephalic. External  right and left ear normal. Oropharynx is clear and moist.  Eyes: Conjunctivae and EOM are normal. PERRLA, no scleral icterus.  Neck: Normal ROM. Neck supple. No JVD. No tracheal deviation. No thyromegaly.  CVS: RRR, S1/S2 +, no murmurs, no gallops, no carotid bruit.  Pulmonary: Effort and breath sounds normal, no stridor, rhonchi, wheezes, rales.  Abdominal: Soft. BS +,  no distension, tenderness, rebound or guarding.  Musculoskeletal: Normal range of motion. No edema and no tenderness.  Lymphadenopathy: No lymphadenopathy noted, cervical, inguinal. Neuro: Alert. Normal reflexes, muscle tone coordination. No cranial nerve deficit. Skin: Skin is warm and dry. No rash noted. Not diaphoretic. No erythema. No pallor.  Psychiatric: Normal mood and affect. Behavior, judgment, thought content normal.   Labs on Admission:  Basic Metabolic Panel:  Recent Labs Lab 10/09/13 1643  NA 137  K 4.1  CL 101  CO2 19  GLUCOSE 130*  BUN 16  CREATININE 0.72  CALCIUM 9.0   Liver Function Tests:  Recent Labs Lab 10/09/13 1643  AST 21  ALT 12  ALKPHOS 68  BILITOT 0.2*  PROT 7.0  ALBUMIN 3.5   CBC:  Recent Labs Lab 10/09/13 1643  WBC 3.5*  NEUTROABS 2.6  HGB 12.8  HCT 36.7  MCV 97.1  PLT 182   Cardiac Enzymes:  Recent Labs Lab 10/09/13 1643  TROPONINI <0.30   Radiological Exams on Admission: Ct Head (brain) Wo Contrast  10/09/2013   There is a 1.1 cm hyperdense focus in the right parietal white matter with surrounding vasogenic edema. This could represent a hyperdense lesion or small hemorrhage. There is also concern for vasogenic edema and a hyperdense lesion near the left vertex and focal edema in the left temporal lobe. Findings raise concern for multiple lesions and metastatic disease. Recommend further characterization with a brain MRI (with and without contrast).     EKG: Normal sinus rhythm, no ST/T wave changes  Faye Ramsay, MD  [facility] Pager  [phone]  If 7PM-7AM, please contact night-coverage [email or web address] Password [facility] 10/09/2013, 7:53 PM

## 2013-10-09 NOTE — Consult Note (Signed)
NEURO HOSPITALIST CONSULT NOTE    Reason for Consult: left leg weakness, staggering.  HPI:                                                                                                                                          Katie [name] is an 80 y.o. female with a past medical history significant for HTN, hyperlipidemia, rectal cancer s/p radiation, anxiety, comes in for evaluation of the above stated symptoms. She said that since yesterday she has been noticing some trouble walking, but today she had difficulty picking up her left leg form the floor and noticed that she was staggering. Never had similar symptoms before. No similar symptoms in her left UE. No HA, vertigo, double vision, difficulty swallowing, focal numbness, slurred speech, language or vision impairment.  CT brain without contrast showed a " 1.1 cm hyperdense focus in the right parietal white matter with surrounding vasogenic edema. This could represent a hyperdense lesion or small hemorrhage. There is also concern for  vasogenic edema and a hyperdense lesion near the left vertex and focal edema in the left temporal lobe".   Past Medical History  Diagnosis Date  . Glaucoma   . Hypertension   . Anxiety   . GERD (gastroesophageal reflux disease)   . Hemorrhoids   . Hyperlipidemia   . Cancer 01/11/13    rectal  . Blood transfusion without reported diagnosis   . History of radiation therapy 01/30/2013-02/17/2013    37.5 gray to the pelvic region    Past Surgical History  Procedure Laterality Date  . Esophagogastroduodenoscopy N/A 01/10/2013    Procedure: ESOPHAGOGASTRODUODENOSCOPY (EGD);  Surgeon: Milus Banister, MD;  Location: [facility];  Service: Endoscopy;  Laterality: N/A;  . Colonoscopy N/A 01/11/2013    Procedure: COLONOSCOPY;  Surgeon: Milus Banister, MD;  Location: [facility];  Service: Endoscopy;  Laterality: N/A;  . Biopsy stomach  01/10/13    mild chronic inflammation, no evidence of  malignancy, dysplasia  . Biopsy rectal  01/11/13    proximal mass-invasive adenocarcinoma  . Abdominal hysterectomy  1968    Partial    Family History  Problem Relation Age of Onset  . Cancer Sister     rectal  . Hyperlipidemia Sister   . Hypertension Sister   . Stroke Mother   . Hypertension Father   . Stroke Father   . Cancer Brother     prostate, seed implant    Social History:  reports that she has never smoked. She has never used smokeless tobacco. She reports that she does not drink alcohol or use illicit drugs.  No Known Allergies  MEDICATIONS:  I have reviewed the patient's current medications.   ROS:                                                                                                                                       History obtained from the patient and chart review  General ROS: negative for - chills, fatigue, fever, night sweats, weight gain or weight loss Psychological ROS: negative for - behavioral disorder, hallucinations, memory difficulties, mood swings or suicidal ideation Ophthalmic ROS: negative for - blurry vision, double vision, eye pain or loss of vision ENT ROS: negative for - epistaxis, nasal discharge, oral lesions, sore throat, tinnitus or vertigo Allergy and Immunology ROS: negative for - hives or itchy/watery eyes Hematological and Lymphatic ROS: negative for - bleeding problems, bruising or swollen lymph nodes Endocrine ROS: negative for - galactorrhea, hair pattern changes, polydipsia/polyuria or temperature intolerance Respiratory ROS: negative for - cough, hemoptysis, shortness of breath or wheezing Cardiovascular ROS: negative for - chest pain, dyspnea on exertion, edema or irregular heartbeat Gastrointestinal ROS: negative for - abdominal pain, diarrhea, hematemesis, nausea/vomiting or stool  incontinence Genito-Urinary ROS: negative for - dysuria, hematuria, incontinence or urinary frequency/urgency Musculoskeletal ROS: negative for - joint swellin Neurological ROS: as noted in HPI Dermatological ROS: negative for rash and skin lesion changes   Physical exam: pleasant female in no apparent distress. Blood pressure 135/83, pulse 85, temperature 98.1 F (36.7 C), temperature source Oral, resp. rate 18, SpO2 99.00%. Head: normocephalic. Neck: supple, no bruits, no JVD. Cardiac: no murmurs. Lungs: clear. Abdomen: soft, no tender, no mass. Extremities: no edema.  Neurologic Examination:                                                                                                      Mental Status: Alert, oriented, thought content appropriate.  Speech fluent without evidence of aphasia.  Able to follow 3 step commands without difficulty. Cranial Nerves: II: Discs flat bilaterally; Visual fields grossly normal, pupils equal, round, reactive to light and accommodation III,IV, VI: ptosis not present, extra-ocular motions intact bilaterally V,VII: smile symmetric, facial light touch sensation normal bilaterally VIII: hearing normal bilaterally IX,X: gag reflex present XI: bilateral shoulder shrug XII: midline tongue extension without atrophy or fasciculations  Motor: Right : Upper extremity   5/5    Left:     Upper extremity   5/5  Lower extremity   5/5     Lower extremity   5/5 Tone and bulk:normal tone throughout; no  atrophy noted Sensory: Pinprick and light touch intact throughout, bilaterally Deep Tendon Reflexes:  Right: Upper Extremity   Left: Upper extremity   biceps (C-5 to C-6) 2/4   biceps (C-5 to C-6) 2/4 tricep (C7) 2/4    triceps (C7) 2/4 Brachioradialis (C6) 2/4  Brachioradialis (C6) 2/4  Lower Extremity Lower Extremity  quadriceps (L-2 to L-4) 2/4   quadriceps (L-2 to L-4) 2/4 Achilles (S1) 2/4   Achilles (S1) 2/4  Plantars: Right: downgoing   Left:  downgoing Cerebellar: normal finger-to-nose,  normal heel-to-shin test Gait: No tested. CV: pulses palpable throughout    No results found for this basename: cbc, bmp, coags, chol, tri, ldl, hga1c    Results for orders placed during the hospital encounter of 10/09/13 (from the past 48 hour(s))  CBC     Status: Abnormal   Collection Time    10/09/13  4:43 PM      Result Value Range   WBC 3.5 (*) 4.0 - 10.5 K/uL   RBC 3.78 (*) 3.87 - 5.11 MIL/uL   Hemoglobin 12.8  12.0 - 15.0 g/dL   HCT 36.7  36.0 - 46.0 %   MCV 97.1  78.0 - 100.0 fL   MCH 33.9  26.0 - 34.0 pg   MCHC 34.9  30.0 - 36.0 g/dL   RDW 13.8  11.5 - 15.5 %   Platelets 182  150 - 400 K/uL  DIFFERENTIAL     Status: Abnormal   Collection Time    10/09/13  4:43 PM      Result Value Range   Neutrophils Relative % 74  43 - 77 %   Neutro Abs 2.6  1.7 - 7.7 K/uL   Lymphocytes Relative 13  12 - 46 %   Lymphs Abs 0.5 (*) 0.7 - 4.0 K/uL   Monocytes Relative 10  3 - 12 %   Monocytes Absolute 0.3  0.1 - 1.0 K/uL   Eosinophils Relative 3  0 - 5 %   Eosinophils Absolute 0.1  0.0 - 0.7 K/uL   Basophils Relative 0  0 - 1 %   Basophils Absolute 0.0  0.0 - 0.1 K/uL  COMPREHENSIVE METABOLIC PANEL     Status: Abnormal   Collection Time    10/09/13  4:43 PM      Result Value Range   Sodium 137  137 - 147 mEq/L   Potassium 4.1  3.7 - 5.3 mEq/L   Chloride 101  96 - 112 mEq/L   CO2 19  19 - 32 mEq/L   Glucose, Bld 130 (*) 70 - 99 mg/dL   BUN 16  6 - 23 mg/dL   Creatinine, Ser 0.72  0.50 - 1.10 mg/dL   Calcium 9.0  8.4 - 10.5 mg/dL   Total Protein 7.0  6.0 - 8.3 g/dL   Albumin 3.5  3.5 - 5.2 g/dL   AST 21  0 - 37 U/L   ALT 12  0 - 35 U/L   Alkaline Phosphatase 68  39 - 117 U/L   Total Bilirubin 0.2 (*) 0.3 - 1.2 mg/dL   GFR calc non Af Amer 81 (*) >90 mL/min   GFR calc Af Amer >90  >90 mL/min   Comment: (NOTE)     The eGFR has been calculated using the CKD EPI equation.     This calculation has not been validated in all  clinical situations.     eGFR's persistently <90 mL/min signify possible Chronic Kidney  Disease.  TROPONIN I     Status: None   Collection Time    10/09/13  4:43 PM      Result Value Range   Troponin I <0.30  <0.30 ng/mL   Comment:            Due to the release kinetics of cTnI,     a negative result within the first hours     of the onset of symptoms does not rule out     myocardial infarction with certainty.     If myocardial infarction is still suspected,     repeat the test at appropriate intervals.  POCT I-STAT TROPONIN I     Status: None   Collection Time    10/09/13  5:02 PM      Result Value Range   Troponin i, poc 0.00  0.00 - 0.08 ng/mL   Comment 3            Comment: Due to the release kinetics of cTnI,     a negative result within the first hours     of the onset of symptoms does not rule out     myocardial infarction with certainty.     If myocardial infarction is still suspected,     repeat the test at appropriate intervals.    Ct Head (brain) Wo Contrast  10/09/2013   CLINICAL DATA:  Left arm and leg weakness.  EXAM: CT HEAD WITHOUT CONTRAST  TECHNIQUE: Contiguous axial images were obtained from the base of the skull through the vertex without contrast.  COMPARISON:  None  FINDINGS: There is a 1.0 x 1.1 cm hyperdense lesion in the right parietal white matter just above the right lateral ventricle. There is vasogenic edema around this hyperdense focus. Findings are consistent with a small intraparenchymal hemorrhage or lesion. Patient does have underlying white matter changes but there is also vasogenic edema in the left frontal lobe near the vertex and concern for a small hyperdense lesion in the left frontal-parietal vertex on image number 27. There is also concern for a small area of edema in the left temporal lobe on image number 11.  There is no evidence for midline shift or significant hydrocephalus. There is deformity of the medial left orbital wall consistent  with old injury. No acute bone abnormality.  IMPRESSION: There is a 1.1 cm hyperdense focus in the right parietal white matter with surrounding vasogenic edema. This could represent a hyperdense lesion or small hemorrhage. There is also concern for vasogenic edema and a hyperdense lesion near the left vertex and focal edema in the left temporal lobe. Findings raise concern for multiple lesions and metastatic disease. Recommend further characterization with a brain MRI (with and without contrast).  Critical Value/emergent results were called by telephone at the time of interpretation on 10/09/2013 at 6:13 PM to Dr. Joseph Berkshire , who verbally acknowledged these results.   Electronically Signed   By: Markus Daft M.D.   On: 10/09/2013 18:18   Assessment/Plan: 80 y/o with rectal cancer and new onset left leg weakness (no deficits at this time) and CT brain without contrast showing finding concerning for metastatic hemorrhagic lesion involving the right parietal, left vertex, and focal edema left temporal lobe. She has no clinical evidence of increase intracranial pressure and her neuro-exam is unimoressive. Recommend: 1) MRI brain with and without contrast. 2) Decadron 10 mg IV now and then 4 mg every 6 hours. 3) Oncology consult. Will follow up.  Dorian Pod, MD 10/09/2013, 6:58 PM

## 2013-10-09 NOTE — ED Notes (Signed)
MD at bedside. 

## 2013-10-09 NOTE — ED Provider Notes (Signed)
CSN: [id number]     Arrival date & time 10/09/13  1606 History   First MD Initiated Contact with Patient 10/09/13 1733     Chief Complaint  Patient presents with  . Weakness   (Consider location/radiation/quality/duration/timing/severity/associated sxs/prior Treatment) HPI Comments: Patient presents to the ER for evaluation of possible left-sided weakness. The patient reports that she noticed that she was stubbing her left toe a lot when she walked yesterday and then today it felt like when she was trying to walk, she wasn't able to lift that leg is high. She says she has been kind of stumbling and staggering, falling to the left when she walks.  Patient has not noticed any numbness or tingling. She has difficulty moving her left arm chronically secondary to chronic pain in the left shoulder region.  Patient has a history of rectal cancer, recently treated with radiation therapy.  Patient is a 80 y.o. female presenting with weakness.  Weakness Pertinent negatives include no headaches.    Past Medical History  Diagnosis Date  . Glaucoma   . Hypertension   . Anxiety   . GERD (gastroesophageal reflux disease)   . Hemorrhoids   . Hyperlipidemia   . Cancer 01/11/13    rectal  . Blood transfusion without reported diagnosis   . History of radiation therapy 01/30/2013-02/17/2013    37.5 gray to the pelvic region   Past Surgical History  Procedure Laterality Date  . Esophagogastroduodenoscopy N/A 01/10/2013    Procedure: ESOPHAGOGASTRODUODENOSCOPY (EGD);  Surgeon: Milus Banister, MD;  Location: [facility];  Service: Endoscopy;  Laterality: N/A;  . Colonoscopy N/A 01/11/2013    Procedure: COLONOSCOPY;  Surgeon: Milus Banister, MD;  Location: [facility];  Service: Endoscopy;  Laterality: N/A;  . Biopsy stomach  01/10/13    mild chronic inflammation, no evidence of malignancy, dysplasia  . Biopsy rectal  01/11/13    proximal mass-invasive adenocarcinoma  . Abdominal hysterectomy  1968   Partial   Family History  Problem Relation Age of Onset  . Cancer Sister     rectal  . Hyperlipidemia Sister   . Hypertension Sister   . Stroke Mother   . Hypertension Father   . Stroke Father   . Cancer Brother     prostate, seed implant   History  Substance Use Topics  . Smoking status: Never Smoker   . Smokeless tobacco: Never Used  . Alcohol Use: No   OB History   Grav Para Term Preterm Abortions TAB SAB Ect Mult Living                 Review of Systems  Neurological: Positive for weakness. Negative for dizziness, numbness and headaches.  All other systems reviewed and are negative.    Allergies  Review of patient's allergies indicates no known allergies.  Home Medications   Current Outpatient Rx  Name  Route  Sig  Dispense  Refill  . ALPRAZolam (XANAX) 0.5 MG tablet   Oral   Take 1 tablet (0.5 mg total) by mouth at bedtime as needed for sleep.   30 tablet   0   . benazepril-hydrochlorthiazide (LOTENSIN HCT) 20-25 MG per tablet   Oral   Take 1 tablet by mouth 2 (two) times daily.          . ferrous sulfate 325 (65 FE) MG tablet   Oral   Take 325 mg by mouth 2 (two) times daily.         Marland Kitchen latanoprost (  XALATAN) 0.005 % ophthalmic solution   Both Eyes   Place 1 drop into both eyes at bedtime.          . sennosides-docusate sodium (SENOKOT-S) 8.6-50 MG tablet   Oral   Take 1 tablet by mouth 2 (two) times daily as needed for constipation.          . verapamil (CALAN-SR) 120 MG CR tablet   Oral   Take 120 mg by mouth 2 (two) times daily.          BP 150/79  Pulse 108  Temp(Src) 98.1 F (36.7 C) (Oral)  Resp 18  SpO2 100% Physical Exam  Constitutional: She is oriented to person, place, and time. She appears well-developed and well-nourished. No distress.  HENT:  Head: Normocephalic and atraumatic.  Right Ear: Hearing normal.  Left Ear: Hearing normal.  Nose: Nose normal.  Mouth/Throat: Oropharynx is clear and moist and mucous  membranes are normal.  Eyes: Conjunctivae and EOM are normal. Pupils are equal, round, and reactive to light.  Neck: Normal range of motion. Neck supple.  Cardiovascular: Regular rhythm, S1 normal and S2 normal.  Exam reveals no gallop and no friction rub.   No murmur heard. Pulmonary/Chest: Effort normal and breath sounds normal. No respiratory distress. She exhibits no tenderness.  Abdominal: Soft. Normal appearance and bowel sounds are normal. There is no hepatosplenomegaly. There is no tenderness. There is no rebound, no guarding, no tenderness at McBurney's point and negative Murphy's sign. No hernia.  Musculoskeletal:       Left shoulder: She exhibits decreased range of motion and tenderness. She exhibits no deformity.  Neurological: She is alert and oriented to person, place, and time. She has normal strength. No cranial nerve deficit or sensory deficit. Coordination normal. GCS eye subscore is 4. GCS verbal subscore is 5. GCS motor subscore is 6.  Patient has equal grip strength bilaterally. Left arm strength is difficult to ascertain because of her chronic left shoulder pain and painful inhibition.  Skin: Skin is warm, dry and intact. No rash noted. No cyanosis.  Psychiatric: She has a normal mood and affect. Her speech is normal and behavior is normal. Thought content normal.    ED Course  Procedures (including critical care time) Labs Review Labs Reviewed  CBC - Abnormal; Notable for the following:    WBC 3.5 (*)    RBC 3.78 (*)    All other components within normal limits  DIFFERENTIAL - Abnormal; Notable for the following:    Lymphs Abs 0.5 (*)    All other components within normal limits  COMPREHENSIVE METABOLIC PANEL - Abnormal; Notable for the following:    Glucose, Bld 130 (*)    Total Bilirubin 0.2 (*)    GFR calc non Af Amer 81 (*)    All other components within normal limits  TROPONIN I  PROTIME-INR  APTT  POCT I-STAT TROPONIN I   Imaging Review No results  found.  EKG Interpretation    Date/Time:  Monday October 09 2013 16:47:06 EST Ventricular Rate:  105 PR Interval:  146 QRS Duration: 72 QT Interval:  336 QTC Calculation: 444 R Axis:   -33 Text Interpretation:  Sinus tachycardia Left axis deviation Possible Anterior infarct , age undetermined No significant changes Confirmed by Gwendolyn GrantWALDEN  MD, BLAIR ([id number]) on 10/09/2013 5:01:25 PM            MDM  Diagnosis: Left-sided weakness with new metastatic rectal cancer to brain  Patient presents to  the ER for evaluation of left-sided weakness. She equal strength with grip and raising her left leg on examination. She is not experiencing any sensory deficit. Complete examination of the left arm was difficult because of chronic pain in the left shoulder from rotator cuff issues. Patient is not experiencing any headache and has normal mentation and cranial nerve function.  Patient's blood work was unremarkable. CT scan of the head showed multiple areas of suspected metastatic disease. There is a hyperdense focus that could include acute bleed as well.  Patient recently switched treatment of her rectal cancer from [facility] to [facility]. I discussed the findings of the CAT scan with the patient and implications. I also asked her if she would like to be transferred to [facility] or to be further worked up here. She will need MRI of the brain to determine if there is any bleeding and the extent of metastatic disease. Patient does not want to go to work tonight. She reports that it would be hard for her family due to the distance. She wishes to stay here and have the MRI performed, be given her treatment options.  Case was discussed briefly with Doctor Armida Sans. He feels that the patient should be admitted to the hospitalist service, had MRI with and without contrast and will follow. At this point the patient is awake, alert, oriented and has a very benign exam. There are no headaches or neurologic findings. She is very  stable. She does not require neurosurgical intervention.   Orpah Greek, MD 10/09/13 [phone]

## 2013-10-09 NOTE — ED Notes (Signed)
Report called to floor RN. Pt will go to floor after returning from MRI

## 2013-10-09 NOTE — ED Notes (Signed)
Pt ambulated to bathroom. Pt slightly staggers. Standby assist.

## 2013-10-10 ENCOUNTER — Inpatient Hospital Stay (HOSPITAL_COMMUNITY): Payer: Medicare Other

## 2013-10-10 ENCOUNTER — Encounter (HOSPITAL_COMMUNITY): Payer: Self-pay | Admitting: *Deleted

## 2013-10-10 DIAGNOSIS — I517 Cardiomegaly: Secondary | ICD-10-CM

## 2013-10-10 DIAGNOSIS — C20 Malignant neoplasm of rectum: Secondary | ICD-10-CM

## 2013-10-10 LAB — BASIC METABOLIC PANEL
BUN: 13 mg/dL (ref 6–23)
CHLORIDE: 101 meq/L (ref 96–112)
CO2: 24 meq/L (ref 19–32)
Calcium: 9.2 mg/dL (ref 8.4–10.5)
Creatinine, Ser: 0.58 mg/dL (ref 0.50–1.10)
GFR calc Af Amer: >90 mL/min (ref >90)
GFR calc non Af Amer: 87 mL/min — ABNORMAL LOW (ref >90)
Glucose, Bld: 138 mg/dL — ABNORMAL HIGH (ref 70–99)
Potassium: 4 mEq/L (ref 3.7–5.3)
Sodium: 139 mEq/L (ref 137–147)

## 2013-10-10 LAB — HEMOGLOBIN A1C
Hgb A1c MFr Bld: 5.9 % — ABNORMAL HIGH (ref <5.7)
Mean Plasma Glucose: 123 mg/dL — ABNORMAL HIGH (ref <117)

## 2013-10-10 LAB — LIPID PANEL
CHOL/HDL RATIO: 5.5 ratio
Cholesterol: 264 mg/dL — ABNORMAL HIGH (ref 0–200)
HDL: 48 mg/dL (ref >39)
LDL CALC: 204 mg/dL — AB (ref 0–99)
Triglycerides: 61 mg/dL (ref <150)
VLDL: 12 mg/dL (ref 0–40)

## 2013-10-10 LAB — CBC
HCT: 38.4 % (ref 36.0–46.0)
Hemoglobin: 13.2 g/dL (ref 12.0–15.0)
MCH: 33.2 pg (ref 26.0–34.0)
MCHC: 34.4 g/dL (ref 30.0–36.0)
MCV: 96.5 fL (ref 78.0–100.0)
PLATELETS: 198 10*3/uL (ref 150–400)
RBC: 3.98 MIL/uL (ref 3.87–5.11)
RDW: 13.6 % (ref 11.5–15.5)
WBC: 2.9 10*3/uL — AB (ref 4.0–10.5)

## 2013-10-10 MED ORDER — DEXAMETHASONE 4 MG PO TABS: 4.0000 mg | ORAL_TABLET | Freq: Four times a day (QID) | ORAL | Status: DC

## 2013-10-10 NOTE — Progress Notes (Signed)
UR complete.  Shuronda Santino RN, MSN 

## 2013-10-10 NOTE — Evaluation (Signed)
Occupational Therapy Evaluation Patient Details Name: Katie [name] MRN: [id number] DOB: [birth year] Today's Date: 10/10/2013 Time: 7681-1572 OT Time Calculation (min): 22 min  OT Assessment / Plan / Recommendation History of present illness 80 yo female with Lt side weakness. MRI (+) At least 7 infratentorial and 5 supratentorial enhancing lesions highly concerning for metastatic disease, the largest lesion within right inferior cerebellum measuring 15 x 12 mm. Associated vasogenic edema without midline shift. No intracranial hemorrhage or hemorrhagic lesions. Subcentimeter right parafalcine/anterior cranial fossa extra-axial enhancing lesion likely reflects a meningioma.    Clinical Impression   PT admitted with lt side weakness. Pt currently with functional limitiations due to the deficits listed below (see OT problem list). MRI (+) for lesions. Pt will benefit from skilled OT to increase their independence and safety with adls and balance to allow discharge Alpha. Pt with pending eye surgery on Friday 10/13/13 and asking if she can attend this surgery. MD made aware of patients questions. Ot to follow for balance deficits.     OT Assessment  Patient needs continued OT Services    Follow Up Recommendations  Home health OT;Supervision/Assistance - 24 hour    Barriers to Discharge      Equipment Recommendations  None recommended by OT (question need for cane)    Recommendations for Other Services    Frequency  Min 2X/week    Precautions / Restrictions Precautions Precautions: Fall Restrictions Weight Bearing Restrictions: No   Pertinent Vitals/Pain None reported    ADL  Eating/Feeding: Set up Where Assessed - Eating/Feeding: Bed level Grooming: Teeth care;Minimal assistance Where Assessed - Grooming: Supported standing Toilet Transfer: Minimal assistance Armed forces technical officer Method: Sit to Loss adjuster, chartered: Regular height toilet Equipment Used: Gait belt ADL  Comments: Pt supine on arrival finishing breakfast. Pt notes incr weakness in LT UE. pt requires MIN (A) to ambualte to bathroom. pt static standing for grooming. pt drifting to the left with prolonged static standing. pt unaware of drift and knee buckle. Pt unable to brace or correct due to LT UE deficits. Pt positioned in chair at end of session. pt total (A) for hair grooming. pt has son live with her and does not work. Recommend HHOT at this time based on son able to give 24/7 (A). If family is unable to help with transfers will need SNF level of care. PT reports no known knowledge at this time of MRI results    OT Diagnosis: Generalized weakness;Disturbance of vision;Hemiplegia non-dominant side  OT Problem List: Decreased strength;Decreased activity tolerance;Impaired balance (sitting and/or standing);Decreased safety awareness;Decreased knowledge of use of DME or AE;Decreased knowledge of precautions;Obesity;Impaired UE functional use OT Treatment Interventions: Self-care/ADL training;Therapeutic exercise;DME and/or AE instruction;Neuromuscular education;Therapeutic activities;Patient/family education;Balance training   OT Goals(Current goals can be found in the care plan section) Acute Rehab OT Goals Patient Stated Goal: to return home  OT Goal Formulation: With patient Time For Goal Achievement: 10/24/13 Potential to Achieve Goals: Good  Visit Information  Last OT Received On: 10/10/13 Assistance Needed: +1 History of Present Illness: 80 yo female with Lt side weakness. MRI (+) At least 7 infratentorial and 5 supratentorial enhancing lesions highly concerning for metastatic disease, the largest lesion within right inferior cerebellum measuring 15 x 12 mm. Associated vasogenic edema without midline shift. No intracranial hemorrhage or hemorrhagic lesions. Subcentimeter right parafalcine/anterior cranial fossa extra-axial enhancing lesion likely reflects a meningioma.        Prior  Functioning     Home Living Family/patient expects  to be discharged to:: Private residence Living Arrangements: Children (son) Available Help at Discharge: Family;Available 24 hours/day Type of Home: Other(Comment) (condo) Home Access: Stairs to enter Entrance Stairs-Number of Steps: 2 Entrance Stairs-Rails: None Home Layout: One level Home Equipment: Walker - standard (rw belonged to father, sister has all other equipment) Additional Comments: driving until admission Prior Function Level of Independence: Independent Communication Communication: No difficulties Dominant Hand: Right         Vision/Perception Vision - History Baseline Vision: Wears glasses all the time Visual History: Glaucoma;Other (comment) (pending surg- for virus scheduled for 10/13/13)   Cognition  Cognition Arousal/Alertness: Awake/alert Behavior During Therapy: WFL for tasks assessed/performed Overall Cognitive Status: Within Functional Limits for tasks assessed    Extremity/Trunk Assessment Upper Extremity Assessment Upper Extremity Assessment: LUE deficits/detail LUE Deficits / Details: hx of Lt shoulder deficits, Pt wfl elbow flexion / extension, 3 out 5 MMT grasp strength, shoulder flexion 10 degrees AROM, pt is unable to abduct shoulder without use of RT UE. Pt is able to supinate and pronate.  LUE Coordination: decreased gross motor;decreased fine motor (unable to open tooth paste which is a change) Lower Extremity Assessment Lower Extremity Assessment: Defer to PT evaluation;LLE deficits/detail (weakness noted with static standing, knee buckle)     Mobility Bed Mobility Bed Mobility: Supine to Sit;Sitting - Scoot to Edge of Bed Supine to Sit: 4: Min guard;With rails;HOB flat Sitting - Scoot to Edge of Bed: 4: Min guard;With rail Details for Bed Mobility Assistance: pt requires incr time and bed railt o progress to EOB Transfers Transfers: Sit to Stand;Stand to Sit Sit to Stand: 4: Min  assist;With upper extremity assist;From bed Stand to Sit: 4: Min assist;With upper extremity assist;To chair/3-in-1 Details for Transfer Assistance: reaching and pushing up with RT UE. Pt with slight posterior lean with static standing at EOB     Exercise     Balance Balance Balance Assessed: Yes Static Standing Balance Static Standing - Balance Support: No upper extremity supported;During functional activity Static Standing - Level of Assistance: 4: Min assist   End of Session OT - End of Session Activity Tolerance: Patient tolerated treatment well Patient left: in chair;with call bell/phone within reach Nurse Communication: Mobility status;Precautions  GO     Peri Maris 10/10/2013, 9:20 AM Pager: [phone]

## 2013-10-10 NOTE — Discharge Summary (Signed)
Physician Discharge Summary  Katie [name] ZOX:[id number] DOB: 12-13-1933 DOA: 10/09/2013  PCP: Avon GullyFANTA,TESFAYE, MD  Admit date: 10/09/2013 Discharge date: 10/10/2013  Time spent: 40 minutes  Recommendations for Outpatient Follow-up:  1. transfer to [facility]. Accepting physician Dr Sharyne RichtersJeff Crawford ( floor oncology)  Discharge Diagnoses:  Principal Problem:   Metastasis to brain  Active Problems:   Leukocytopenia, unspecified   Solitary pulmonary nodule   Rectal cancer   Left-sided weakness   Discharge Condition: fair  Diet recommendation: regular  There were no vitals filed for this visit.  History of present illness:  Please refer to admission H&P for details, but in brief, 80 y.o. female with a past medical history significant for HTN, hyperlipidemia, rectal cancer s/p radiation, anxiety, comes in for evaluation of the above stated symptoms.  She said that since yesterday she has been noticing some trouble walking, but today she had difficulty picking up her left leg form the floor and noticed that she was staggering. Never had similar symptoms before.  No similar symptoms in her left UE. No HA, vertigo, double vision, difficulty swallowing, focal numbness, slurred speech, language or vision impairment.  CT brain without contrast showed a " 1.1 cm hyperdense focus in the right parietal white matter with surrounding vasogenic edema. This could represent a hyperdense lesion or small hemorrhage. There is also concern for  vasogenic edema and a hyperdense lesion near the left vertex and focal edema in the left temporal lobe".    Hospital Course:   Left-sided weakness  Secondary to multiple metastatic brain mets as commented on MRI brain. "At least 7 infratentorial and 5 supratentorial enhancing lesions highly concerning for metastatic disease, the largest lesion within  right inferior cerebellum measuring 15 x 12 mm. Associated vasogenic  edema without midline shift. No  intracranial hemorrhage or  hemorrhagic lesions." -on IV decadron 4 mg q6hr -Neuro checks . Continue PT. -I called her oncologist Dr Noel GeroldYousuf  Zafar [facility] and spoke with covering oncologist Dr Carvel GettingLeigh howard. Patient had few cycles fo chemo until October last year and radiation therapy to mets to left scapula in November 2014. She agrees to have [facility] and need of radiation to brain mets.  Spoke with accepting physician Dr Trey PaulaJeff crawford. ( floor medical oncologist). Patient can be transferred to medical onc floor once bed available. Currently do not have any bed. -i also spoke with Dr Myrle ShengSherill who was previously following her here . He should be contacted for any any issues while patient is here.      Active Problems:   Metastatic Rectal cancer  - getting chemo and radiation therapy at [facility]  Leukocytopenia, unspecified  - chronic and secondary to malignancy   Solitary pulmonary nodule  Appears to have pulm mets . Also getting radiation therapy for left scapular mets  HTN  - continue home medical regimen   Code Status: Full  Family Communication: none at bedside  [facility].   Procedures:  none  Consultations:  none  Discharge Exam: Filed Vitals:   10/10/13 1000  BP: 129/77  Pulse: 96  Temp: 97.6 F (36.4 C)  Resp:    General: Female in no acute distress HEENT: No pallor, moist oral mucosa Chest: Clear to auscultation bilaterally, no added sounds CVS: Normal S1 and S2, no murmurs rub or gallop Abdomen: Soft, nontender, nondistended, bowel sounds present Extremities: Warm, no edema CNS: AAO x3, 3+/5 power  left upper extremity, 4+/5 bowel or left lower extremity.    Discharge Instructions     Medication List         ALPRAZolam 0.5 MG tablet  Commonly known as:  XANAX  Take 1 tablet (0.5 mg total) by mouth at bedtime as needed for sleep.      benazepril-hydrochlorthiazide 20-25 MG per tablet  Commonly known as:  LOTENSIN HCT  Take 1 tablet by mouth 2 (two) times daily.     dexamethasone 4 MG tablet  Commonly known as:  DECADRON  Take 1 tablet (4 mg total) by mouth every 6 (six) hours.     ferrous sulfate 325 (65 FE) MG tablet  Take 325 mg by mouth 2 (two) times daily.     latanoprost 0.005 % ophthalmic solution  Commonly known as:  XALATAN  Place 1 drop into both eyes at bedtime.     pantoprazole 40 MG tablet  Commonly known as:  PROTONIX  Take 40 mg by mouth daily.     sulfamethoxazole-trimethoprim 800-160 MG per tablet  Commonly known as:  BACTRIM DS,SEPTRA DS  Take 1 tablet by mouth 2 (two) times daily.     valACYclovir 1000 MG tablet  Commonly known as:  VALTREX  Take 1,000 mg by mouth 3 (three) times daily.     verapamil 120 MG CR tablet  Commonly known as:  CALAN-SR  Take 120 mg by mouth 2 (two) times daily.       No Known Allergies    The results of significant diagnostics from this hospitalization (including imaging, microbiology, ancillary and laboratory) are listed below for reference.    Significant Diagnostic Studies: Ct Head (brain) Wo Contrast  10/09/2013   CLINICAL DATA:  Left arm and leg weakness.  EXAM: CT HEAD WITHOUT CONTRAST  TECHNIQUE: Contiguous axial images were obtained from the base of the skull through the vertex without contrast.  COMPARISON:  None  FINDINGS: There is a 1.0 x 1.1 cm hyperdense lesion in the right parietal white matter just above the right lateral ventricle. There is vasogenic edema around this hyperdense focus. Findings are consistent with a small intraparenchymal hemorrhage or lesion. Patient does have underlying white matter changes but there is also vasogenic edema in the left frontal lobe near the vertex and concern for a small hyperdense lesion in the left frontal-parietal vertex on image number 27. There is also concern for a small area of edema in the left temporal  lobe on image number 11.  There is no evidence for midline shift or significant hydrocephalus. There is deformity of the medial left orbital wall consistent with old injury. No acute bone abnormality.  IMPRESSION: There is a 1.1 cm hyperdense focus in the right parietal white matter with surrounding vasogenic edema. This could represent a hyperdense lesion or small hemorrhage. There is also concern for vasogenic edema and a hyperdense lesion near the left vertex and focal edema in the left temporal lobe. Findings raise concern for multiple lesions and metastatic disease. Recommend further characterization with a brain MRI (with and without contrast).  Critical Value/emergent results were called by telephone at the time of interpretation on 10/09/2013 at 6:13 PM to Dr. Joseph Berkshire , who verbally acknowledged these results.   Electronically Signed   By: Markus Daft M.D.   On: 10/09/2013 18:18   Mr Jeri Cos QI Contrast  10/09/2013   CLINICAL DATA:  Rectal cancer, gait imbalance, possible intracranial lesions.  EXAM: MRI HEAD WITHOUT AND WITH  CONTRAST  TECHNIQUE: Multiplanar, multiecho pulse sequences of the brain and surrounding structures were obtained without and with intravenous contrast.  CONTRAST:  47mL MULTIHANCE GADOBENATE DIMEGLUMINE 529 MG/ML IV SOLN  COMPARISON:  CT of the head October 09, 2013 at 1751 hr  FINDINGS: At least 7 infratentorial and 5 supratentorial intraparenchymal enhancing metastasis seen. (dominant right inferior 15 x 12 mm lesion ; including 7 x 5 mm mesial left temporal, 7 x 6 mm left temporal, 12 x 11 mm right posterior frontal, 9 x 13 mm left posterior frontal lesions). Lesions show low T2 signal, and surrounding T2 bright vasogenic edema. Subcentimeter enhancing focus along the anterior inferior falx/ right anterior cranial fossa may reflect meningioma as there is hyperostosis on prior head CT. No midline shift. No reduced diffusion to suggest acute ischemia. No suspicious  intraparenchymal intrinsic T1 shortening to suggest blood products.  Moderate ventriculomegaly, likely on the basis of global parenchymal brain volume loss as there is commensurate enlargement of cerebral sulci and cerebellar folia. Patchy supratentorial white matter T2 hyperintensities, exclusive of the vasogenic edema suggests moderate chronic small vessel ischemic disease. No susceptibility artifact to suggest hemorrhage.  No abnormal extra-axial fluid collections, or leptomeningeal enhancement. Major intracranial vascular flow voids observed at the skull base, with mild the local ectatic appearance which may reflect chronic hypertension.  Ocular globes and orbital contents are nonsuspicious though not tailored for evaluation. Remote left medial orbital blowout fracture. The paranasal sinuses and mastoid air cells appear well-aerated. At least mild temporomandibular osteoarthrosis. No abnormal sellar expansion. Craniocervical junction maintained. No definite abnormal calvarial bone marrow signal.  IMPRESSION: At least 7 infratentorial and 5 supratentorial enhancing lesions highly concerning for metastatic disease, the largest lesion within right inferior cerebellum measuring 15 x 12 mm. Associated vasogenic edema without midline shift. No intracranial hemorrhage or hemorrhagic lesions.  Subcentimeter right parafalcine/anterior cranial fossa extra-axial enhancing lesion likely reflects a meningioma.  Involutional changes. Moderate white matter changes suggest chronic small vessel ischemic disease.   Electronically Signed   By: Elon Alas   On: 10/09/2013 22:47    Microbiology: No results found for this or any previous visit (from the past 240 hour(s)).   Labs: Basic Metabolic Panel:  Recent Labs Lab 10/09/13 1643 10/10/13 0635  NA 137 139  K 4.1 4.0  CL 101 101  CO2 19 24  GLUCOSE 130* 138*  BUN 16 13  CREATININE 0.72 0.58  CALCIUM 9.0 9.2   Liver Function Tests:  Recent Labs Lab  10/09/13 1643  AST 21  ALT 12  ALKPHOS 68  BILITOT 0.2*  PROT 7.0  ALBUMIN 3.5   No results found for this basename: LIPASE, AMYLASE,  in the last 168 hours No results found for this basename: AMMONIA,  in the last 168 hours CBC:  Recent Labs Lab 10/09/13 1643 10/10/13 0635  WBC 3.5* 2.9*  NEUTROABS 2.6  --   HGB 12.8 13.2  HCT 36.7 38.4  MCV 97.1 96.5  PLT 182 198   Cardiac Enzymes:  Recent Labs Lab 10/09/13 1643  TROPONINI <0.30   BNP: BNP (last 3 results) No results found for this basename: PROBNP,  in the last 8760 hours CBG: No results found for this basename: GLUCAP,  in the last 168 hours     Signed:  Dalon Reichart, [place]  [facility] 10/10/2013, 11:27 AM

## 2013-10-10 NOTE — Progress Notes (Signed)
  Echocardiogram 2D Echocardiogram has been performed.  Katie [name] Katie [name] 10/10/2013, 2:12 PM

## 2013-10-10 NOTE — Progress Notes (Signed)
*  PRELIMINARY RESULTS* Vascular Ultrasound Carotid Duplex (Doppler) has been completed.  Preliminary findings: Bilateral:  1-39% ICA stenosis.  Vertebral artery flow is antegrade.      Landry Mellow, RDMS, RVT  10/10/2013, 1:15 PM

## 2013-10-10 NOTE — Evaluation (Signed)
Physical Therapy Evaluation Patient Details Name: Katie [name] MRN: [id number] DOB: 1933/11/27 Today's Date: 10/10/2013 Time: 4098-1191 PT Time Calculation (min): 22 min  PT Assessment / Plan / Recommendation History of Present Illness  80 yo female with Lt side weakness. MRI (+) At least 7 infratentorial and 5 supratentorial enhancing lesions highly concerning for metastatic disease, the largest lesion within right inferior cerebellum measuring 15 x 12 mm. Associated vasogenic edema without midline shift. No intracranial hemorrhage or hemorrhagic lesions. Subcentimeter right parafalcine/anterior cranial fossa extra-axial enhancing lesion likely reflects a meningioma.   Clinical Impression  Pt very motivated to improve overall mobility and return to baseline.  Will continue to follow.      PT Assessment  Patient needs continued PT services    Follow Up Recommendations  Outpatient PT;Supervision - Intermittent    Does the patient have the potential to tolerate intense rehabilitation      Barriers to Discharge        Equipment Recommendations  Rolling walker with 5" wheels    Recommendations for Other Services     Frequency Min 3X/week    Precautions / Restrictions Precautions Precautions: Fall Restrictions Weight Bearing Restrictions: No   Pertinent Vitals/Pain Denied pain.        Mobility  Bed Mobility Bed Mobility: Not assessed Supine to Sit: 4: Min guard;With rails;HOB flat Sitting - Scoot to Edge of Bed: 4: Min guard;With rail Details for Bed Mobility Assistance: pt requires incr time and bed railt o progress to EOB Transfers Transfers: Sit to Stand;Stand to Sit Sit to Stand: 4: Min guard;With upper extremity assist;From chair/3-in-1;With armrests Stand to Sit: 4: Min guard;With upper extremity assist;To chair/3-in-1;With armrests Details for Transfer Assistance: pt utilizes R UE to A with trasnfers.  Cues to control descent to sitting.    Ambulation/Gait Ambulation/Gait Assistance: 4: Min guard Ambulation Distance (Feet): 300 Feet Assistive device: None Ambulation/Gait Assistance Details: pt moves slowly and mildly unsteady with L decreased DF and knee instability.   Gait Pattern: Step-through pattern;Decreased stride length;Decreased step length - left;Decreased stance time - left;Decreased dorsiflexion - left Stairs: No Wheelchair Mobility Wheelchair Mobility: No Modified Rankin (Stroke Patients Only) Pre-Morbid Rankin Score: No symptoms Modified Rankin: Moderately severe disability    Exercises     PT Diagnosis: Difficulty walking  PT Problem List: Decreased strength;Decreased activity tolerance;Decreased balance;Decreased mobility;Decreased coordination;Decreased knowledge of use of DME PT Treatment Interventions: DME instruction;Gait training;Stair training;Functional mobility training;Therapeutic activities;Therapeutic exercise;Balance training;Neuromuscular re-education;Patient/family education     PT Goals(Current goals can be found in the care plan section) Acute Rehab PT Goals Patient Stated Goal: to return home  PT Goal Formulation: With patient Time For Goal Achievement: 10/24/13 Potential to Achieve Goals: Good  Visit Information  Last PT Received On: 10/10/13 Assistance Needed: +1 History of Present Illness: 80 yo female with Lt side weakness. MRI (+) At least 7 infratentorial and 5 supratentorial enhancing lesions highly concerning for metastatic disease, the largest lesion within right inferior cerebellum measuring 15 x 12 mm. Associated vasogenic edema without midline shift. No intracranial hemorrhage or hemorrhagic lesions. Subcentimeter right parafalcine/anterior cranial fossa extra-axial enhancing lesion likely reflects a meningioma.        Prior [place] expects to be discharged to:: Private residence Living Arrangements: Children (son) Available Help at  Discharge: Family;Available 24 hours/day Type of Home: Other(Comment) (condo) Home Access: Stairs to enter Entrance Stairs-Number of Steps: 2 Entrance Stairs-Rails: None Home Layout: One level Home Equipment: Walker - standard (rw  belonged to father, sister has all other equipment) Additional Comments: driving until admission.  pt told OT 2 steps to enter home and told PT only has to step up on door threshold, no stairs.   Prior Function Level of Independence: Independent Communication Communication: No difficulties Dominant Hand: Right    Cognition  Cognition Arousal/Alertness: Awake/alert Behavior During Therapy: WFL for tasks assessed/performed Overall Cognitive Status: Within Functional Limits for tasks assessed    Extremity/Trunk Assessment Upper Extremity Assessment Upper Extremity Assessment: Defer to OT evaluation LUE Deficits / Details: hx of Lt shoulder deficits, Pt wfl elbow flexion / extension, 3 out 5 MMT grasp strength, shoulder flexion 10 degrees AROM, pt is unable to abduct shoulder without use of RT UE. Pt is able to supinate and pronate.  LUE Coordination: decreased gross motor;decreased fine motor (unable to open tooth paste which is a change) Lower Extremity Assessment Lower Extremity Assessment: LLE deficits/detail LLE Deficits / Details: Knee and ankle grossly 4-/5 with hip 4+/5.   LLE Coordination: decreased fine motor;decreased gross motor Cervical / Trunk Assessment Cervical / Trunk Assessment: Normal   Balance Balance Balance Assessed: Yes Static Standing Balance Static Standing - Balance Support: No upper extremity supported Static Standing - Level of Assistance: 4: Min assist  End of Session PT - End of Session Equipment Utilized During Treatment: Gait belt Activity Tolerance: Patient tolerated treatment well Patient left: in chair;with call bell/phone within reach Nurse Communication: Mobility status  GP     Katie [name],  [facility] 10/10/2013, 10:06 AM

## 2013-10-11 NOTE — Evaluation (Addendum)
SLP Cancellation Note  Patient Details Name: Katie [name] MRN: [id number] DOB: [birth year]   Cancelled treatment:       Reason Eval/Treat Not Completed:  (note in chart for pt to transfer to [place], SLP eval may be completed at that facility)  If pt remains in house, speech evaluation may be completed 10/12/13.  Spoke to RN who reports pt communicating adequately.  Thanks.    Luanna Salk, [place] [facility] SLP [phone]

## 2013-10-11 NOTE — Progress Notes (Signed)
PT Cancellation Note  Patient Details Name: Katie [name] MRN: [id number] DOB: [birth year]   Cancelled Treatment:    Reason Eval/Treat Not Completed: Patient declined, no reason specified.  Will try back another time.     Araeya Lamb, [place] 10/11/2013, 12:21 PM

## 2013-10-11 NOTE — Discharge Summary (Signed)
Physician Discharge Summary  Katie [name] [id number] DOB: 1934/07/23 DOA: 10/09/2013  PCP: Rosita Fire, MD  Admit date: 10/09/2013 Discharge date: 10/11/2013  Time spent: 40 minutes  Recommendations for Outpatient Follow-up:  1. transfer to [place]. Accepting physician Dr Linzie Collin ( floor oncology)  Discharge Diagnoses:  Principal Problem:   Metastasis to brain  Active Problems:   Leukocytopenia, unspecified   Solitary pulmonary nodule   Rectal cancer   Left-sided weakness   Discharge Condition: fair  Diet recommendation: regular  Filed Weights   10/09/13 1629  Weight: 78.019 kg (172 lb)    History of present illness:  Please refer to admission H&P for details, but in brief, 80 y.o. female with a past medical history significant for HTN, hyperlipidemia, rectal cancer s/p radiation, anxiety, comes in for evaluation of the above stated symptoms.  She said that since yesterday she has been noticing some trouble walking, but today she had difficulty picking up her left leg form the floor and noticed that she was staggering. Never had similar symptoms before.  No similar symptoms in her left UE. No HA, vertigo, double vision, difficulty swallowing, focal numbness, slurred speech, language or vision impairment.  CT brain without contrast showed a " 1.1 cm hyperdense focus in the right parietal white matter with surrounding vasogenic edema. This could represent a hyperdense lesion or small hemorrhage. There is also concern for  vasogenic edema and a hyperdense lesion near the left vertex and focal edema in the left temporal lobe".    Hospital Course:   Left-sided weakness  Secondary to multiple metastatic brain mets as commented on MRI brain. "At least 7 infratentorial and 5 supratentorial enhancing lesions highly concerning for metastatic disease, the largest lesion within  right inferior cerebellum measuring 15 x 12 mm. Associated vasogenic  edema without midline  shift. No intracranial hemorrhage or  hemorrhagic lesions." -on IV decadron 4 mg q6hr -Neuro checks . Continue PT. -I called her oncologist Dr Teryl Lucy at [facility] and spoke with covering oncologist Dr Bernie Covey. Patient had few cycles fo chemo until October last year and radiation therapy to mets to left scapula in November 2014. She agrees to have patient transferred to [facility] for further care and need of radiation to brain mets.  Spoke with accepting physician Dr Merry Proud crawford. ( floor medical oncologist). Patient can be transferred to medical onc floor once bed available. Currently do not have any bed. -i also spoke with Dr Learta Codding who was previously following her here . He should be contacted for any any issues while patient is here.      Active Problems:   Metastatic Rectal cancer  - getting chemo and [facility] therapy at [facility]  Leukocytopenia, unspecified  - chronic and secondary to malignancy   Solitary pulmonary nodule  Appears to have pulm mets . Also getting radiation therapy for left scapular mets  HTN  - continue home medical regimen   Code Status: Full  Family Communication: none at bedside  Disposition Plan: discharge to [facility] once bed available.   Procedures:  none  Consultations:  none  Discharge Exam: Filed Vitals:   10/11/13 1410  BP: 104/66  Pulse: 75  Temp: 98 F (36.7 C)  Resp: 20   General: Female in no acute distress HEENT: No pallor, moist oral mucosa Chest: Clear to auscultation bilaterally, no added sounds CVS: Normal S1 and S2, no murmurs rub or gallop Abdomen: Soft, nontender, nondistended, bowel sounds present Extremities: Warm, no  edema CNS: AAO x3, 3+/5 power left upper extremity, 4+/5 bowel or left lower extremity.    Discharge Instructions     Medication List         ALPRAZolam 0.5 MG tablet  Commonly known as:  XANAX  Take 1 tablet (0.5 mg total) by mouth at bedtime as needed for sleep.      benazepril-hydrochlorthiazide 20-25 MG per tablet  Commonly known as:  LOTENSIN HCT  Take 1 tablet by mouth 2 (two) times daily.     dexamethasone 4 MG tablet  Commonly known as:  DECADRON  Take 1 tablet (4 mg total) by mouth every 6 (six) hours.     ferrous sulfate 325 (65 FE) MG tablet  Take 325 mg by mouth 2 (two) times daily.     latanoprost 0.005 % ophthalmic solution  Commonly known as:  XALATAN  Place 1 drop into both eyes at bedtime.     pantoprazole 40 MG tablet  Commonly known as:  PROTONIX  Take 40 mg by mouth daily.     sulfamethoxazole-trimethoprim 800-160 MG per tablet  Commonly known as:  BACTRIM DS,SEPTRA DS  Take 1 tablet by mouth 2 (two) times daily.     valACYclovir 1000 MG tablet  Commonly known as:  VALTREX  Take 1,000 mg by mouth 3 (three) times daily.     verapamil 120 MG CR tablet  Commonly known as:  CALAN-SR  Take 120 mg by mouth 2 (two) times daily.       No Known Allergies    The results of significant diagnostics from this hospitalization (including imaging, microbiology, ancillary and laboratory) are listed below for reference.    Significant Diagnostic Studies: Ct Head (brain) Wo Contrast  10/09/2013   CLINICAL DATA:  Left arm and leg weakness.  EXAM: CT HEAD WITHOUT CONTRAST  TECHNIQUE: Contiguous axial images were obtained from the base of the skull through the vertex without contrast.  COMPARISON:  None  FINDINGS: There is a 1.0 x 1.1 cm hyperdense lesion in the right parietal white matter just above the right lateral ventricle. There is vasogenic edema around this hyperdense focus. Findings are consistent with a small intraparenchymal hemorrhage or lesion. Patient does have underlying white matter changes but there is also vasogenic edema in the left frontal lobe near the vertex and concern for a small hyperdense lesion in the left frontal-parietal vertex on image number 27. There is also concern for a small area of edema in the left temporal  lobe on image number 11.  There is no evidence for midline shift or significant hydrocephalus. There is deformity of the medial left orbital wall consistent with old injury. No acute bone abnormality.  IMPRESSION: There is a 1.1 cm hyperdense focus in the right parietal white matter with surrounding vasogenic edema. This could represent a hyperdense lesion or small hemorrhage. There is also concern for vasogenic edema and a hyperdense lesion near the left vertex and focal edema in the left temporal lobe. Findings raise concern for multiple lesions and metastatic disease. Recommend further characterization with a brain MRI (with and without contrast).  Critical Value/emergent results were called by telephone at the time of interpretation on 10/09/2013 at 6:13 PM to Dr. Joseph Berkshire , who verbally acknowledged these results.   Electronically Signed   By: Markus Daft M.D.   On: 10/09/2013 18:18   Mr Jeri Cos PR Contrast  10/09/2013   CLINICAL DATA:  Rectal cancer, gait imbalance, possible intracranial lesions.  EXAM: MRI HEAD WITHOUT AND WITH CONTRAST  TECHNIQUE: Multiplanar, multiecho pulse sequences of the brain and surrounding structures were obtained without and with intravenous contrast.  CONTRAST:  21mL MULTIHANCE GADOBENATE DIMEGLUMINE 529 MG/ML IV SOLN  COMPARISON:  CT of the head October 09, 2013 at 1751 hr  FINDINGS: At least 7 infratentorial and 5 supratentorial intraparenchymal enhancing metastasis seen. (dominant right inferior 15 x 12 mm lesion ; including 7 x 5 mm mesial left temporal, 7 x 6 mm left temporal, 12 x 11 mm right posterior frontal, 9 x 13 mm left posterior frontal lesions). Lesions show low T2 signal, and surrounding T2 bright vasogenic edema. Subcentimeter enhancing focus along the anterior inferior falx/ right anterior cranial fossa may reflect meningioma as there is hyperostosis on prior head CT. No midline shift. No reduced diffusion to suggest acute ischemia. No suspicious  intraparenchymal intrinsic T1 shortening to suggest blood products.  Moderate ventriculomegaly, likely on the basis of global parenchymal brain volume loss as there is commensurate enlargement of cerebral sulci and cerebellar folia. Patchy supratentorial white matter T2 hyperintensities, exclusive of the vasogenic edema suggests moderate chronic small vessel ischemic disease. No susceptibility artifact to suggest hemorrhage.  No abnormal extra-axial fluid collections, or leptomeningeal enhancement. Major intracranial vascular flow voids observed at the skull base, with mild the local ectatic appearance which may reflect chronic hypertension.  Ocular globes and orbital contents are nonsuspicious though not tailored for evaluation. Remote left medial orbital blowout fracture. The paranasal sinuses and mastoid air cells appear well-aerated. At least mild temporomandibular osteoarthrosis. No abnormal sellar expansion. Craniocervical junction maintained. No definite abnormal calvarial bone marrow signal.  IMPRESSION: At least 7 infratentorial and 5 supratentorial enhancing lesions highly concerning for metastatic disease, the largest lesion within right inferior cerebellum measuring 15 x 12 mm. Associated vasogenic edema without midline shift. No intracranial hemorrhage or hemorrhagic lesions.  Subcentimeter right parafalcine/anterior cranial fossa extra-axial enhancing lesion likely reflects a meningioma.  Involutional changes. Moderate white matter changes suggest chronic small vessel ischemic disease.   Electronically Signed   By: Elon Alas   On: 10/09/2013 22:47    Microbiology: No results found for this or any previous visit (from the past 240 hour(s)).   Labs: Basic Metabolic Panel:  Recent Labs Lab 10/09/13 1643 10/10/13 0635  NA 137 139  K 4.1 4.0  CL 101 101  CO2 19 24  GLUCOSE 130* 138*  BUN 16 13  CREATININE 0.72 0.58  CALCIUM 9.0 9.2   Liver Function Tests:  Recent Labs Lab  10/09/13 1643  AST 21  ALT 12  ALKPHOS 68  BILITOT 0.2*  PROT 7.0  ALBUMIN 3.5   No results found for this basename: LIPASE, AMYLASE,  in the last 168 hours No results found for this basename: AMMONIA,  in the last 168 hours CBC:  Recent Labs Lab 10/09/13 1643 10/10/13 0635  WBC 3.5* 2.9*  NEUTROABS 2.6  --   HGB 12.8 13.2  HCT 36.7 38.4  MCV 97.1 96.5  PLT 182 198   Cardiac Enzymes:  Recent Labs Lab 10/09/13 1643  TROPONINI <0.30   BNP: BNP (last 3 results) No results found for this basename: PROBNP,  in the last 8760 hours CBG: No results found for this basename: GLUCAP,  in the last 168 hours     Signed:  Thong Feeny  [facility] 10/11/2013, 2:20 PM  Patient seen and examined today. No new complaints.

## 2013-10-12 MED ORDER — DEXAMETHASONE 4 MG PO TABS
4.0000 mg | ORAL_TABLET | Freq: Four times a day (QID) | ORAL | Status: DC
Start: 2013-10-12 — End: 2013-10-12
  Administered 2013-10-12: 4 mg via ORAL
  Filled 2013-10-12 (×4): qty 1

## 2013-10-12 MED ORDER — DEXAMETHASONE 4 MG PO TABS: 4.0000 mg | ORAL_TABLET | Freq: Four times a day (QID) | ORAL | Status: DC

## 2013-10-12 NOTE — Progress Notes (Signed)
OT NOTE  Pt to d/c to [facility] today and d/c further OT treatment to [facility]. OT to sign off acutely   Jeri Modena   OTR/L Pager: [phone] Office: [phone] .

## 2013-10-12 NOTE — Progress Notes (Signed)
Noted plans for d/c to [facility] today. Defer SLP evaluation to new facility.   Porter, Amber [phone]

## 2013-10-12 NOTE — Progress Notes (Signed)
PT Cancellation and Discharge Note  Patient Details Name: HANG AMMON MRN: [id number] DOB: April 13, 1934   Cancelled Treatment:    Reason Eval/Treat Not Completed: Other (comment)  Pt to transfer to [facility] today.  Will defer further therapy to [facility] acute care.  Will sign off.     Sereena Marando, Thornton Papas 10/12/2013, 2:38 PM

## 2013-10-14 ENCOUNTER — Emergency Department (HOSPITAL_COMMUNITY)
Admission: EM | Admit: 2013-10-14 | Discharge: 2013-10-14 | Disposition: A | Discharge status: Home / Self Care | Payer: Medicare Other | Attending: Emergency Medicine | Admitting: Emergency Medicine

## 2013-10-14 ENCOUNTER — Encounter (HOSPITAL_COMMUNITY): Payer: Self-pay | Admitting: Emergency Medicine

## 2013-10-14 DIAGNOSIS — C20 Malignant neoplasm of rectum: Secondary | ICD-10-CM | POA: Insufficient documentation

## 2013-10-14 DIAGNOSIS — I69959 Hemiplegia and hemiparesis following unspecified cerebrovascular disease affecting unspecified side: Secondary | ICD-10-CM | POA: Insufficient documentation

## 2013-10-14 DIAGNOSIS — K219 Gastro-esophageal reflux disease without esophagitis: Secondary | ICD-10-CM | POA: Insufficient documentation

## 2013-10-14 DIAGNOSIS — R531 Weakness: Secondary | ICD-10-CM

## 2013-10-14 DIAGNOSIS — C7949 Secondary malignant neoplasm of other parts of nervous system: Principal | ICD-10-CM

## 2013-10-14 DIAGNOSIS — G8929 Other chronic pain: Secondary | ICD-10-CM | POA: Insufficient documentation

## 2013-10-14 DIAGNOSIS — Z79899 Other long term (current) drug therapy: Secondary | ICD-10-CM | POA: Insufficient documentation

## 2013-10-14 DIAGNOSIS — I10 Essential (primary) hypertension: Secondary | ICD-10-CM | POA: Insufficient documentation

## 2013-10-14 DIAGNOSIS — E785 Hyperlipidemia, unspecified: Secondary | ICD-10-CM | POA: Insufficient documentation

## 2013-10-14 DIAGNOSIS — F411 Generalized anxiety disorder: Secondary | ICD-10-CM | POA: Insufficient documentation

## 2013-10-14 DIAGNOSIS — I69993 Ataxia following unspecified cerebrovascular disease: Secondary | ICD-10-CM | POA: Insufficient documentation

## 2013-10-14 DIAGNOSIS — H409 Unspecified glaucoma: Secondary | ICD-10-CM | POA: Insufficient documentation

## 2013-10-14 DIAGNOSIS — Z923 Personal history of irradiation: Secondary | ICD-10-CM | POA: Insufficient documentation

## 2013-10-14 DIAGNOSIS — C801 Malignant (primary) neoplasm, unspecified: Secondary | ICD-10-CM

## 2013-10-14 DIAGNOSIS — C7931 Secondary malignant neoplasm of brain: Secondary | ICD-10-CM | POA: Insufficient documentation

## 2013-10-14 MED ORDER — DEXAMETHASONE 6 MG PO TABS
10.0000 mg | ORAL_TABLET | Freq: Once | ORAL | Status: AC
  Administered 2013-10-14: 10 mg via ORAL
  Filled 2013-10-14: qty 1

## 2013-10-14 NOTE — ED Notes (Signed)
Bed: PV66 Expected date:  Expected time:  Means of arrival:  Comments: Arm numbness

## 2013-10-14 NOTE — ED Notes (Signed)
MD at bedside. 

## 2013-10-14 NOTE — ED Notes (Signed)
To ED from home [facility], cancer pt with recent admission, c/o increasing weakness and left upper arm pain, A/O X4, ambulatory with assist, NAD

## 2013-10-14 NOTE — ED Provider Notes (Signed)
CSN: [id number]     Arrival date & time 10/14/13  0216 History   First MD Initiated Contact with Patient 10/14/13 0220     Chief Complaint  Patient presents with  . Weakness   (Consider location/radiation/quality/duration/timing/severity/associated sxs/prior Treatment) HPI 80 year old female presents to the emergency department with complaint of worsening weakness and imbalance.  Patient was discharged from the hospital on Thursday.  She reports that she slept most of Thursday, and Friday.  Patient had significant difficulty getting out of her bed.  She lives with family and has a walker, but reports the weakness and imbalance is causing her concern.  Patient has history of rectal cancer, with metastasis to left scapula and new diagnosis of multiple metastasis to the brain including the cerebellum.  Patient was to be transferred from: To [facility], where she receives most of her oncology care.  Bed there was not available, and patient requested discharge home to take care of healthcare paperwork.  Patient was given a prescription for Decadron, but has not filled or taken any of it.  No confusion, headaches, fever, chills, nausea, vomiting, diarrhea.  She has chronic pain to her left shoulder, due to metastasis. Past Medical History  Diagnosis Date  . Glaucoma   . Hypertension   . Anxiety   . GERD (gastroesophageal reflux disease)   . Hemorrhoids   . Hyperlipidemia   . Cancer 01/11/13    rectal  . Blood transfusion without reported diagnosis   . History of radiation therapy 01/30/2013-02/17/2013    37.5 gray to the pelvic region   Past Surgical History  Procedure Laterality Date  . Esophagogastroduodenoscopy N/A 01/10/2013    Procedure: ESOPHAGOGASTRODUODENOSCOPY (EGD);  Surgeon: Milus Banister, MD;  Location: [facility];  Service: Endoscopy;  Laterality: N/A;  . Colonoscopy N/A 01/11/2013    Procedure: COLONOSCOPY;  Surgeon: Milus Banister, MD;  Location: [facility] ENDOSCOPY;  Service:  Endoscopy;  Laterality: N/A;  . Biopsy stomach  01/10/13    mild chronic inflammation, no evidence of malignancy, dysplasia  . Biopsy rectal  01/11/13    proximal mass-invasive adenocarcinoma  . Abdominal hysterectomy  1968    Partial   Family History  Problem Relation Age of Onset  . Cancer Sister     rectal  . Hyperlipidemia Sister   . Hypertension Sister   . Stroke Mother   . Hypertension Father   . Stroke Father   . Cancer Brother     prostate, seed implant   History  Substance Use Topics  . Smoking status: Never Smoker   . Smokeless tobacco: Never Used  . Alcohol Use: No   OB History   Grav Para Term Preterm Abortions TAB SAB Ect Mult Living                 Review of Systems  See History of Present Illness; otherwise all other systems are reviewed and negative Allergies  Review of patient's allergies indicates no known allergies.  Home Medications   Current Outpatient Rx  Name  Route  Sig  Dispense  Refill  . ALPRAZolam (XANAX) 0.5 MG tablet   Oral   Take 1 tablet (0.5 mg total) by mouth at bedtime as needed for sleep.   30 tablet   0   . benazepril-hydrochlorthiazide (LOTENSIN HCT) 20-25 MG per tablet   Oral   Take 1 tablet by mouth 2 (two) times daily.          . ferrous sulfate 325 (65 FE)  MG tablet   Oral   Take 325 mg by mouth 2 (two) times daily.         Marland Kitchen latanoprost (XALATAN) 0.005 % ophthalmic solution   Both Eyes   Place 1 drop into both eyes at bedtime.          . pantoprazole (PROTONIX) 40 MG tablet   Oral   Take 40 mg by mouth daily.         Marland Kitchen senna-docusate (SENOKOT-S) 8.6-50 MG per tablet   Oral   Take 1 tablet by mouth daily as needed.         . verapamil (CALAN-SR) 120 MG CR tablet   Oral   Take 120 mg by mouth 2 (two) times daily.         Marland Kitchen dexamethasone (DECADRON) 4 MG tablet   Oral   Take 4 mg by mouth every 6 (six) hours.         . valACYclovir (VALTREX) 1000 MG tablet   Oral   Take 1,000 mg by mouth 3  (three) times daily.          BP 118/84  Pulse 94  Temp(Src) 98.7 F (37.1 C) (Oral)  Resp 18  SpO2 96% Physical Exam  Nursing note and vitals reviewed. Constitutional: She is oriented to person, place, and time. She appears well-developed and well-nourished. She appears distressed.  HENT:  Head: Normocephalic and atraumatic.  Right Ear: External ear normal.  Left Ear: External ear normal.  Nose: Nose normal.  Mouth/Throat: Oropharynx is clear and moist.  Eyes: Conjunctivae and EOM are normal. Pupils are equal, round, and reactive to light.  Neck: Normal range of motion. Neck supple. No JVD present. No tracheal deviation present. No thyromegaly present.  Cardiovascular: Normal rate, regular rhythm, normal heart sounds and intact distal pulses.  Exam reveals no gallop and no friction rub.   No murmur heard. Pulmonary/Chest: Effort normal and breath sounds normal. No stridor. No respiratory distress. She has no wheezes. She has no rales. She exhibits no tenderness.  Abdominal: Soft. Bowel sounds are normal. She exhibits no distension and no mass. There is no tenderness. There is no rebound and no guarding.  Musculoskeletal: Normal range of motion. She exhibits tenderness (patient with significant tenderness with range of motion to left shoulder, chronic in nature.). She exhibits no edema.  Lymphadenopathy:    She has no cervical adenopathy.  Neurological: She is oriented to person, place, and time. She has normal reflexes. No cranial nerve deficit. She exhibits normal muscle tone. Coordination (patient with significant ataxia.  Patient also with problems with heel, shin on right, and left) abnormal.  She has normal strength in right arm, left leg.  Right leg.  Skin: Skin is warm and dry. No rash noted. No erythema. No pallor.  Psychiatric: She has a normal mood and affect. Her behavior is normal. Judgment and thought content normal.    ED Course  Procedures (including critical care  time) Labs Review Labs Reviewed - No data to display Imaging Review No results found.  EKG Interpretation   None       MDM   1. Metastasis to brain   2. Left-sided weakness   3. Cancer    79 year-old female with brain metastasis, including right cerebellum, with worsening problems with walking, especially with the left lower jaw, but he.  She has not been on her Decadron since discharge.  Contacted due to transfer center, who is familiar with her case.  They still as of yet.  Do not have any beds available.  Patient is scheduled on Wednesday the 14th for a clinic appointment with her oncologist, as well as chemotherapy and radiation.  Patient given Decadron here.  We'll try her with our walker to see how she balances.  She may need admission back to the hospital for PT and OT until time that she can be transferred to [facility] for further workup.   5:29 AM Pt has done well with rolling walker, is stable and safe for d/c at this time.  She has been given strict instructions to fill her decadron prescription, use walker at all times and f/u on Wednesday at [facility].   Kalman Drape, MD 10/14/13 0530

## 2013-10-14 NOTE — Discharge Instructions (Signed)
Please fill your prescription for dexamethasone as it will help with swelling around the metastasis areas in your brain.  Use rolling walker.  Follow up on Wednesday with your oncologist at [facility].  Return to the ER for worsening condition or new concerning symptoms.  You should be contacted by [place] for evaluation for home physical therapy.   Metastatic Brain Tumor A brain tumor is a growth in the brain. These tumors may be classified as primary or secondary. A primary brain tumor starts out in the cells of the brain. A secondary brain tumor means the tumor started elsewhere in the body and then traveled to the brain through the bloodstream. This spread of cancer is called metastasis.  Brain metastases outnumber primary tumors by at least 10 to 1. And they occur in 20% to 40% of cancer patients. About one quarter of lung cancer patients are discovered because their cancer spreads to the brain, before the patient knows there are problems in the lungs. The exact incidence of cancer spread to the brain is unknown. But it has been estimated that more new cases are diagnosed each year. This number may be increasing due to:  Capacity of MRI (magnetic resonance imaging) scans to detect small metastases.  Prolonged cancer survival, resulting from improved therapy. 80% of brain metastases occur in the cerebral hemispheres. 15% occur in the cerebellum. 5% occur in the brain stem. In more than 70% of cases, there are multiple metastases to the brain. But solitary metastases also occur.  Brain tumors can develop directly from cancers that exist between the nasal passages and throat (nasopharyngeal region) by spreading along the cranial nerves or though the opening at the base of the skull (foramen magnum). Metastases in the thick covering over the brain surface (dura) may count for up to 9% of total central nervous system (CNS) metastases. Even if a patient has had a previous cancer, a lesion in  the brain should not be assumed to be a metastasis. This could result in overlooking appropriate treatment of a curable tumor. Brain tumors that start in the brain (primary) rarely spread to other areas of the body. But they can spread to other parts of the brain and to the spinal cord (axis). The most common primary cancers that cause a metastasis in the brain are:  Lung cancer (50%).  Unknown primary cancer (10% to 15%).  Colon cancer (5%).  Breast cancer (15% to 20%).  Skin cancer (melanoma) (10%). SYMPTOMS  A patient with a brain tumor may experience:  Headaches.  Weakness.  Seizures.  Sensory problems.  Problems with walking.  Tiredness.  Emotional changes.  Personality changes. Family and friends are often the first to notice emotional and personality changes. DIAGNOSIS  The diagnosis of brain metastases in cancer patients is based on:  Patient history.  A nervous system function test (neurological exam).  Specialized X-rays.  A brain tissue sample (biopsy). This is usually a stereotactic biopsy, which uses a precisely directed, delicate instrument. Biopsy may be needed when there is a:  Solitary lesion.  Questionable relationship to a primary tumor, if one is present elsewhere in the body. TREATMENT  Your treatment will depend on what the problem is and where it came from. Sometimes, removal of the tumor will tell you where it started. It may be that the primary tumor can also be removed. Chemotherapy or radiation treatments are sometimes required. Your caregiver will discuss treatment options with you. Document Released: 06/17/2004 Document Revised: 12/14/2011  Document Reviewed: 09/18/2008 [employer] Patient Information 2014 [place].

## 2013-10-18 ENCOUNTER — Emergency Department (HOSPITAL_COMMUNITY)
Admission: EM | Admit: 2013-10-18 | Discharge: 2013-10-18 | Disposition: A | Discharge status: Home / Self Care | Payer: Medicare Other | Attending: Emergency Medicine | Admitting: Emergency Medicine

## 2013-10-18 ENCOUNTER — Encounter (HOSPITAL_COMMUNITY): Payer: Self-pay | Admitting: Emergency Medicine

## 2013-10-18 ENCOUNTER — Emergency Department (HOSPITAL_COMMUNITY): Payer: Medicare Other

## 2013-10-18 DIAGNOSIS — H409 Unspecified glaucoma: Secondary | ICD-10-CM | POA: Insufficient documentation

## 2013-10-18 DIAGNOSIS — Z923 Personal history of irradiation: Secondary | ICD-10-CM | POA: Insufficient documentation

## 2013-10-18 DIAGNOSIS — C7949 Secondary malignant neoplasm of other parts of nervous system: Principal | ICD-10-CM

## 2013-10-18 DIAGNOSIS — Z862 Personal history of diseases of the blood and blood-forming organs and certain disorders involving the immune mechanism: Secondary | ICD-10-CM | POA: Insufficient documentation

## 2013-10-18 DIAGNOSIS — C19 Malignant neoplasm of rectosigmoid junction: Secondary | ICD-10-CM | POA: Insufficient documentation

## 2013-10-18 DIAGNOSIS — Z792 Long term (current) use of antibiotics: Secondary | ICD-10-CM | POA: Insufficient documentation

## 2013-10-18 DIAGNOSIS — K219 Gastro-esophageal reflux disease without esophagitis: Secondary | ICD-10-CM | POA: Insufficient documentation

## 2013-10-18 DIAGNOSIS — Z8639 Personal history of other endocrine, nutritional and metabolic disease: Secondary | ICD-10-CM | POA: Insufficient documentation

## 2013-10-18 DIAGNOSIS — Z79899 Other long term (current) drug therapy: Secondary | ICD-10-CM | POA: Insufficient documentation

## 2013-10-18 DIAGNOSIS — IMO0002 Reserved for concepts with insufficient information to code with codable children: Secondary | ICD-10-CM | POA: Insufficient documentation

## 2013-10-18 DIAGNOSIS — I10 Essential (primary) hypertension: Secondary | ICD-10-CM | POA: Insufficient documentation

## 2013-10-18 DIAGNOSIS — C7931 Secondary malignant neoplasm of brain: Secondary | ICD-10-CM

## 2013-10-18 LAB — CBC
HEMATOCRIT: 40 % (ref 36.0–46.0)
Hemoglobin: 13.9 g/dL (ref 12.0–15.0)
MCH: 33.1 pg (ref 26.0–34.0)
MCHC: 34.8 g/dL (ref 30.0–36.0)
MCV: 95.2 fL (ref 78.0–100.0)
Platelets: 127 10*3/uL — ABNORMAL LOW (ref 150–400)
RBC: 4.2 MIL/uL (ref 3.87–5.11)
RDW: 14 % (ref 11.5–15.5)
WBC: 5.4 10*3/uL (ref 4.0–10.5)

## 2013-10-18 LAB — POCT I-STAT, CHEM 8
BUN: 32 mg/dL — ABNORMAL HIGH (ref 6–23)
CALCIUM ION: 1.06 mmol/L — AB (ref 1.13–1.30)
Chloride: 105 mEq/L (ref 96–112)
Creatinine, Ser: 0.6 mg/dL (ref 0.50–1.10)
GLUCOSE: 160 mg/dL — AB (ref 70–99)
HEMATOCRIT: 41 % (ref 36.0–46.0)
HEMOGLOBIN: 13.9 g/dL (ref 12.0–15.0)
Potassium: 5.9 mEq/L — ABNORMAL HIGH (ref 3.7–5.3)
Sodium: 137 mEq/L (ref 137–147)
TCO2: 27 mmol/L (ref 0–100)

## 2013-10-18 LAB — POTASSIUM: POTASSIUM: 4.1 meq/L (ref 3.7–5.3)

## 2013-10-18 MED ORDER — SODIUM CHLORIDE 0.9 % IV SOLN
INTRAVENOUS | Status: DC
  Administered 2013-10-18: 03:00:00 via INTRAVENOUS

## 2013-10-18 MED ORDER — ONDANSETRON HCL 4 MG/2ML IJ SOLN
4.0000 mg | Freq: Once | INTRAMUSCULAR | Status: AC
  Administered 2013-10-18: 4 mg via INTRAVENOUS
  Filled 2013-10-18: qty 2

## 2013-10-18 MED ORDER — MECLIZINE HCL 25 MG PO TABS
25.0000 mg | ORAL_TABLET | Freq: Once | ORAL | Status: AC
  Administered 2013-10-18: 25 mg via ORAL

## 2013-10-18 NOTE — ED Provider Notes (Signed)
CSN: [id number]     Arrival date & time 10/18/13  0122 History   First MD Initiated Contact with Patient 10/18/13 0151     Chief Complaint  Patient presents with  . Dizziness   (Consider location/radiation/quality/duration/timing/severity/associated sxs/prior Treatment) HPI Hx per PT - At home tonight, developed dizziness unable to walk. PT states she feels like will fall over. No N/V, no diff with speech or vision. She was recently told she had metastatic brain lesions from her colon cancer.  Radiation has been planned but has not been scheduled. No HA. No new weakness or numbness. No F/C. Symptoms MOD in severity. Is currently taking decadron 4mg  every 6 hours.    Past Medical History  Diagnosis Date  . Glaucoma   . Hypertension   . Anxiety   . GERD (gastroesophageal reflux disease)   . Hemorrhoids   . Hyperlipidemia   . Cancer 01/11/13    rectal  . Blood transfusion without reported diagnosis   . History of radiation therapy 01/30/2013-02/17/2013    37.5 gray to the pelvic region   Past Surgical History  Procedure Laterality Date  . Esophagogastroduodenoscopy N/A 01/10/2013    Procedure: ESOPHAGOGASTRODUODENOSCOPY (EGD);  Surgeon: Milus Banister, MD;  Location: [facility];  Service: Endoscopy;  Laterality: N/A;  . Colonoscopy N/A 01/11/2013    Procedure: COLONOSCOPY;  Surgeon: Milus Banister, MD;  Location: [facility];  Service: Endoscopy;  Laterality: N/A;  . Biopsy stomach  01/10/13    mild chronic inflammation, no evidence of malignancy, dysplasia  . Biopsy rectal  01/11/13    proximal mass-invasive adenocarcinoma  . Abdominal hysterectomy  1968    Partial   Family History  Problem Relation Age of Onset  . Cancer Sister     rectal  . Hyperlipidemia Sister   . Hypertension Sister   . Stroke Mother   . Hypertension Father   . Stroke Father   . Cancer Brother     prostate, seed implant   History  Substance Use Topics  . Smoking status: Never Smoker   . Smokeless  tobacco: Never Used  . Alcohol Use: No   OB History   Grav Para Term Preterm Abortions TAB SAB Ect Mult Living                 Review of Systems  Constitutional: Negative for fever and chills.  Eyes: Negative for visual disturbance.  Respiratory: Negative for shortness of breath.   Cardiovascular: Negative for chest pain.  Gastrointestinal: Negative for nausea, vomiting and abdominal pain.  Genitourinary: Negative for flank pain.  Musculoskeletal: Negative for neck pain.  Skin: Negative for rash.  Neurological: Positive for dizziness. Negative for syncope and headaches.  All other systems reviewed and are negative.    Allergies  Review of patient's allergies indicates no known allergies.  Home Medications   Current Outpatient Rx  Name  Route  Sig  Dispense  Refill  . ALPRAZolam (XANAX) 0.5 MG tablet   Oral   Take 1 tablet (0.5 mg total) by mouth at bedtime as needed for sleep.   30 tablet   0   . benazepril-hydrochlorthiazide (LOTENSIN HCT) 20-25 MG per tablet   Oral   Take 1 tablet by mouth 2 (two) times daily.          Marland Kitchen dexamethasone (DECADRON) 4 MG tablet   Oral   Take 4 mg by mouth every 6 (six) hours.         . ferrous sulfate  325 (65 FE) MG tablet   Oral   Take 325 mg by mouth 2 (two) times daily.         Marland Kitchen latanoprost (XALATAN) 0.005 % ophthalmic solution   Both Eyes   Place 1 drop into both eyes at bedtime.          . pantoprazole (PROTONIX) 40 MG tablet   Oral   Take 40 mg by mouth daily.         Marland Kitchen senna-docusate (SENOKOT-S) 8.6-50 MG per tablet   Oral   Take 1 tablet by mouth daily as needed.         . valACYclovir (VALTREX) 1000 MG tablet   Oral   Take 1,000 mg by mouth 3 (three) times daily.         . verapamil (CALAN-SR) 120 MG CR tablet   Oral   Take 120 mg by mouth 2 (two) times daily.          BP 147/64  Pulse 84  Temp(Src) 97.4 F (36.3 C) (Oral)  Resp 16  Ht 5\' 2"  (1.575 m)  Wt 169 lb (76.658 kg)  BMI 30.90  kg/m2  SpO2 98% Physical Exam  Constitutional: She is oriented to person, place, and time. She appears well-developed and well-nourished.  HENT:  Head: Normocephalic and atraumatic.  Eyes: EOM are normal. Pupils are equal, round, and reactive to light.  Neck: Neck supple.  Cardiovascular: Regular rhythm and intact distal pulses.   Pulmonary/Chest: Effort normal. No respiratory distress.  Musculoskeletal: Normal range of motion. She exhibits no edema.  Neurological: She is alert and oriented to person, place, and time. No cranial nerve deficit.  No nystagmus, gait not tested Heel to shin intact Rapid alternating movements UEs intact - (PT unable to use L upper arm due to scapular metastatic disease)  Skin: Skin is warm and dry. No rash noted.    ED Course  Procedures (including critical care time) Labs Review Labs Reviewed  CBC - Abnormal; Notable for the following:    Platelets 127 (*)    All other components within normal limits  POCT I-STAT, CHEM 8 - Abnormal; Notable for the following:    Potassium 5.9 (*)    BUN 32 (*)    Glucose, Bld 160 (*)    Calcium, Ion 1.06 (*)    All other components within normal limits  POTASSIUM   Imaging Review Ct Head Wo Contrast  10/18/2013   CLINICAL DATA:  Dizziness, history of metastatic colorectal cancer with brain metastasis.  EXAM: CT HEAD WITHOUT CONTRAST  TECHNIQUE: Contiguous axial images were obtained from the base of the skull through the vertex without intravenous contrast.  COMPARISON:  CT of the head October 09, 2013. MRI of the brain October 09, 2013.  FINDINGS: Mild vasogenic edema and right cerebellum, right centrum semiovale and left greater than right bifrontal lobes associated with patient's mid metastatic disease better seen on prior MRI. 11 mm hyperdensity in the right centrum semiovale is relatively unchanged. No midline shift.  Ventricles and sulci are normal for age. Similar patchy supratentorial white matter hypodensities,  no intraparenchymal hemorrhage.  No abnormal extra-axial fluid collections. Mild calcific atherosclerosis of the carotid siphons. Remote left medial orbital blowout fracture. No skull fracture. Ocular globes and orbital contents are nonsuspicious.  IMPRESSION: Stable appearance of the head: Brain metastasis better seen on MRI of the brain March 10, 2014 ; no acute intracranial process.   Electronically Signed   By: Elon Alas  On: 10/18/2013 02:54    EKG Interpretation    Date/Time:  Wednesday October 18 2013 03:17:51 EST Ventricular Rate:  62 PR Interval:  158 QRS Duration: 84 QT Interval:  427 QTC Calculation: 434 R Axis:   -25 Text Interpretation:  Sinus rhythm Borderline left axis deviation Confirmed by Ariauna Farabee  MD, Kohner Orlick ([id number]) on 10/18/2013 5:15:02 AM           IV fluids, Antivert and Zofran provided.  Recheck - patient ambulated to the bathroom without assistance and is no longer feeling dizzy.   Previous records reviewed and patient was noted to have a scheduled followup appointment he scheduled for today. I inquired with patient about this and she does have an appointment this morning. She is planning to have her brother take her. She will followup with her oncologist today. She will continue Decadron as prescribed. I offered admission here at [facility] but she prefers to go to [place] today.   MDM  Diagnosis: Periodic dizziness unsteady gait, likely secondary to cerebellum brain metastasis  CT reviewed as above no changes from previous CT scan/MRI Symptomatically improved with IV fluids and medications provided Labs reviewed as above. Elevated potassium likely hemolysis. EKG reviewed no peaked T waves. Repeat potassium normal. Vital signs and nursing notes reviewed.    Teressa Lower, MD 10/18/13 [phone]

## 2013-10-18 NOTE — ED Notes (Signed)
Bed: WA17 Expected date:  Expected time:  Means of arrival:  Comments: EMS weakness / near syncope on chemo

## 2013-10-18 NOTE — ED Notes (Signed)
Brought in by EMS from home with c/o dizziness.  Per EMS, pt called EMS and reported that she started having dizziness---she attempted to rest for a while but dizziness persisted and was advised by family to call EMS.  Pt has colo-rectal cancer with metastasis to brain.

## 2013-10-18 NOTE — Discharge Instructions (Signed)
Colorectal Cancer  Continue taking Decadron as prescribed. Followup with your oncologist today at [facility].  Colorectal cancer is an abnormal growth of tissue (tumor) in the colon or rectum that is cancerous (malignant). Unlike noncancerous (benign) tumors, malignant tumors can spread to other parts of your body. The colon is the large bowel or large intestine. The rectum is the last several inches of the colon.  RISK FACTORS The exact cause of colorectal cancer is unknown. However, the following factors may increase your chances of getting colorectal cancer:   Age older than 66 years.   Abnormal growths (polyps) on the inner wall of the colon or rectum.   Diabetes.   African American race.   Family history of hereditary nonpolyposis colorectal cancer. This condition is caused by changes in the genes that are responsible for repairing mismatched DNA.   Personal history of cancer. A person who has already had colorectal cancer may develop it a second time. Also, women with a history of ovarian, uterine, or breast cancer are at a somewhat higher risk of developing colorectal cancer.  Certain hereditary conditions.  Eating a diet that is high in fat (especially animal fat) and low in fiber, fruits, and vegetables.  Sedentary lifestyle.  Inflammatory bowel disease, including ulcerative colitis and Crohn disease.   Smoking.   Excessive alcohol use.  SYMPTOMS Early colorectal cancer often does not cause symptoms. As the cancer grows, symptoms may include:   Changes in bowel habits.  Diarrhea.   Constipation.   Feeling like the bowel does not empty completely after a bowel movement.   Blood in the stool.   Stools that are narrower than usual.   Abdominal discomfort, pain, bloating, fullness, or cramps.  Frequent gas pain.   Unexplained weight loss.   Constant tiredness.   Nausea and vomiting.  DIAGNOSIS  Your health care provider will ask about  your medical history. He or she may also perform a number of procedures, such as:   A physical exam.  A digital rectal exam.  A fecal occult blood test.  A barium enema.  Blood tests.   X-rays.   Imaging tests, such as CT scans or MRIs.   Taking a tissue sample (biopsy) from your colon or rectum to look for cancer cells.   A sigmoidoscopy to view the inside of the last part of your colon.   A colonoscopy to view the inside of your entire colon.   An endorectal ultrasound to see how deep a rectal tumor has grown and whether the cancer has spread to lymph nodes or other nearby tissues.  Your cancer will be staged to determine its severity and extent. Staging is a careful attempt to find out the size of the tumor, whether the cancer has spread, and if so, to what parts of the body. You may need to have more tests to determine the stage of your cancer. The test results will help determine what treatment plan is best for you.   Stage 0 The cancer is found only in the innermost lining of the colon or rectum.   Stage I The cancer has grown into the inner wall of the colon or rectum. The cancer has not yet reached the outer wall of the colon.   Stage II The cancer extends more deeply into or through the wall of the colon or rectum. It may have invaded nearby tissue, but cancer cells have not spread to the lymph nodes.   Stage III The cancer  has spread to nearby lymph nodes but not to other parts of the body.   Stage IV The cancer has spread to other parts of the body, such as the liver or lungs.  Your health care provider may tell you the detailed stage of your cancer, which includes both a number and a letter.  TREATMENT  Depending on the type and stage, colorectal cancer may be treated with surgery, radiation therapy, chemotherapy, targeted therapy, or radiofrequency ablation. Some people have a combination of these therapies. Surgery may be done to remove the polyps from  your colon. In early stages, your health care provider may be able to do this during a colonoscopy. In later stages, surgery may be done to remove part of your colon.  HOME CARE INSTRUCTIONS   Only take over-the-counter or prescription medicines for pain, discomfort, or fever as directed by your health care provider.   Maintain a healthy diet.   Consider joining a support group. This may help you learn to cope with the stress of having colorectal cancer.   Seek advice to help you manage treatment of side effects.   Keep all follow-up appointments as directed by your health care provider.   Inform your cancer specialist if you are admitted to the hospital.  SEEK MEDICAL CARE IF:  Your diarrhea or constipation does not go away.   Your bowel habits change.  You have increased abdominal pain.   You notice new fatigue or weakness.  You lose weight. Document Released: 09/21/2005 Document Revised: 05/24/2013 Document Reviewed: 03/16/2013 [employer] Patient Information 2014 [place].

## 2013-11-01 ENCOUNTER — Inpatient Hospital Stay (HOSPITAL_COMMUNITY)
Admission: EM | Admit: 2013-11-01 | Discharge: 2013-11-03 | DRG: 054 | Disposition: A | Discharge status: Hospice | Payer: Medicare Other | Attending: Internal Medicine | Admitting: Internal Medicine

## 2013-11-01 ENCOUNTER — Emergency Department (HOSPITAL_COMMUNITY): Payer: Medicare Other

## 2013-11-01 ENCOUNTER — Encounter (HOSPITAL_COMMUNITY): Payer: Self-pay | Admitting: Emergency Medicine

## 2013-11-01 DIAGNOSIS — C7949 Secondary malignant neoplasm of other parts of nervous system: Principal | ICD-10-CM

## 2013-11-01 DIAGNOSIS — Z923 Personal history of irradiation: Secondary | ICD-10-CM

## 2013-11-01 DIAGNOSIS — R531 Weakness: Secondary | ICD-10-CM

## 2013-11-01 DIAGNOSIS — Z515 Encounter for palliative care: Secondary | ICD-10-CM

## 2013-11-01 DIAGNOSIS — H409 Unspecified glaucoma: Secondary | ICD-10-CM | POA: Diagnosis present

## 2013-11-01 DIAGNOSIS — C20 Malignant neoplasm of rectum: Secondary | ICD-10-CM

## 2013-11-01 DIAGNOSIS — C799 Secondary malignant neoplasm of unspecified site: Secondary | ICD-10-CM

## 2013-11-01 DIAGNOSIS — G893 Neoplasm related pain (acute) (chronic): Secondary | ICD-10-CM

## 2013-11-01 DIAGNOSIS — D72819 Decreased white blood cell count, unspecified: Secondary | ICD-10-CM

## 2013-11-01 DIAGNOSIS — E876 Hypokalemia: Secondary | ICD-10-CM

## 2013-11-01 DIAGNOSIS — K921 Melena: Secondary | ICD-10-CM

## 2013-11-01 DIAGNOSIS — K219 Gastro-esophageal reflux disease without esophagitis: Secondary | ICD-10-CM | POA: Diagnosis present

## 2013-11-01 DIAGNOSIS — Z8249 Family history of ischemic heart disease and other diseases of the circulatory system: Secondary | ICD-10-CM

## 2013-11-01 DIAGNOSIS — C7931 Secondary malignant neoplasm of brain: Principal | ICD-10-CM

## 2013-11-01 DIAGNOSIS — I10 Essential (primary) hypertension: Secondary | ICD-10-CM | POA: Diagnosis present

## 2013-11-01 DIAGNOSIS — K922 Gastrointestinal hemorrhage, unspecified: Secondary | ICD-10-CM

## 2013-11-01 DIAGNOSIS — Z8673 Personal history of transient ischemic attack (TIA), and cerebral infarction without residual deficits: Secondary | ICD-10-CM

## 2013-11-01 DIAGNOSIS — G936 Cerebral edema: Secondary | ICD-10-CM

## 2013-11-01 DIAGNOSIS — D696 Thrombocytopenia, unspecified: Secondary | ICD-10-CM | POA: Diagnosis present

## 2013-11-01 DIAGNOSIS — D649 Anemia, unspecified: Secondary | ICD-10-CM

## 2013-11-01 DIAGNOSIS — C801 Malignant (primary) neoplasm, unspecified: Secondary | ICD-10-CM

## 2013-11-01 DIAGNOSIS — Z823 Family history of stroke: Secondary | ICD-10-CM

## 2013-11-01 DIAGNOSIS — R933 Abnormal findings on diagnostic imaging of other parts of digestive tract: Secondary | ICD-10-CM

## 2013-11-01 DIAGNOSIS — R911 Solitary pulmonary nodule: Secondary | ICD-10-CM

## 2013-11-01 DIAGNOSIS — C19 Malignant neoplasm of rectosigmoid junction: Secondary | ICD-10-CM | POA: Diagnosis present

## 2013-11-01 DIAGNOSIS — E785 Hyperlipidemia, unspecified: Secondary | ICD-10-CM | POA: Diagnosis present

## 2013-11-01 DIAGNOSIS — F411 Generalized anxiety disorder: Secondary | ICD-10-CM | POA: Diagnosis present

## 2013-11-01 HISTORY — DX: Secondary malignant neoplasm of brain: C79.31

## 2013-11-01 LAB — COMPREHENSIVE METABOLIC PANEL
ALK PHOS: 55 U/L (ref 39–117)
ALT: 13 U/L (ref 0–35)
AST: 17 U/L (ref 0–37)
Albumin: 2.9 g/dL — ABNORMAL LOW (ref 3.5–5.2)
BILIRUBIN TOTAL: 0.7 mg/dL (ref 0.3–1.2)
BUN: 6 mg/dL (ref 6–23)
CHLORIDE: 105 meq/L (ref 96–112)
CO2: 22 mEq/L (ref 19–32)
Calcium: 8.3 mg/dL — ABNORMAL LOW (ref 8.4–10.5)
Creatinine, Ser: 0.43 mg/dL — ABNORMAL LOW (ref 0.50–1.10)
GFR calc Af Amer: >90 mL/min (ref >90)
GFR calc non Af Amer: >90 mL/min (ref >90)
Glucose, Bld: 112 mg/dL — ABNORMAL HIGH (ref 70–99)
POTASSIUM: 3.5 meq/L — AB (ref 3.7–5.3)
SODIUM: 142 meq/L (ref 137–147)
Total Protein: 5.9 g/dL — ABNORMAL LOW (ref 6.0–8.3)

## 2013-11-01 LAB — CBC WITH DIFFERENTIAL/PLATELET
BASOS ABS: 0 10*3/uL (ref 0.0–0.1)
Basophils Relative: 0 % (ref 0–1)
EOS ABS: 0.1 10*3/uL (ref 0.0–0.7)
Eosinophils Relative: 2 % (ref 0–5)
HCT: 35.2 % — ABNORMAL LOW (ref 36.0–46.0)
HEMOGLOBIN: 12.1 g/dL (ref 12.0–15.0)
Lymphocytes Relative: 10 % — ABNORMAL LOW (ref 12–46)
Lymphs Abs: 0.3 10*3/uL — ABNORMAL LOW (ref 0.7–4.0)
MCH: 33.2 pg (ref 26.0–34.0)
MCHC: 34.4 g/dL (ref 30.0–36.0)
MCV: 96.4 fL (ref 78.0–100.0)
Monocytes Absolute: 0.3 10*3/uL (ref 0.1–1.0)
Monocytes Relative: 10 % (ref 3–12)
NEUTROS ABS: 2.7 10*3/uL (ref 1.7–7.7)
Neutrophils Relative %: 79 % — ABNORMAL HIGH (ref 43–77)
PLATELETS: 124 10*3/uL — AB (ref 150–400)
RBC: 3.65 MIL/uL — ABNORMAL LOW (ref 3.87–5.11)
RDW: 14.3 % (ref 11.5–15.5)
WBC: 3.4 10*3/uL — ABNORMAL LOW (ref 4.0–10.5)

## 2013-11-01 LAB — CREATININE, SERUM
Creatinine, Ser: 0.48 mg/dL — ABNORMAL LOW (ref 0.50–1.10)
GFR calc non Af Amer: >90 mL/min (ref >90)

## 2013-11-01 LAB — CBC
HEMATOCRIT: 38.1 % (ref 36.0–46.0)
Hemoglobin: 13.4 g/dL (ref 12.0–15.0)
MCH: 33.8 pg (ref 26.0–34.0)
MCHC: 35.2 g/dL (ref 30.0–36.0)
MCV: 96.2 fL (ref 78.0–100.0)
PLATELETS: 152 10*3/uL (ref 150–400)
RBC: 3.96 MIL/uL (ref 3.87–5.11)
RDW: 14.2 % (ref 11.5–15.5)
WBC: 3.3 10*3/uL — ABNORMAL LOW (ref 4.0–10.5)

## 2013-11-01 MED ORDER — ACETAMINOPHEN 650 MG RE SUPP
650.0000 mg | Freq: Four times a day (QID) | RECTAL | Status: DC | PRN
Start: 2013-11-01 — End: 2013-11-03

## 2013-11-01 MED ORDER — ASPIRIN 81 MG PO CHEW
324.0000 mg | CHEWABLE_TABLET | Freq: Once | ORAL | Status: AC
  Administered 2013-11-01: 324 mg via ORAL
  Filled 2013-11-01: qty 4

## 2013-11-01 MED ORDER — SODIUM CHLORIDE 0.9 % IV SOLN
INTRAVENOUS | Status: DC
  Administered 2013-11-01 – 2013-11-02 (×2): via INTRAVENOUS
  Administered 2013-11-02: 75 mL/h via INTRAVENOUS
  Administered 2013-11-03: 08:00:00 via INTRAVENOUS

## 2013-11-01 MED ORDER — BENAZEPRIL HCL 20 MG PO TABS
20.0000 mg | ORAL_TABLET | Freq: Two times a day (BID) | ORAL | Status: DC
  Administered 2013-11-01 – 2013-11-03 (×5): 20 mg via ORAL
  Filled 2013-11-01 (×7): qty 1

## 2013-11-01 MED ORDER — OXYCODONE HCL 5 MG PO TABS
5.0000 mg | ORAL_TABLET | ORAL | Status: DC | PRN
Start: 2013-11-01 — End: 2013-11-03
  Administered 2013-11-01 – 2013-11-03 (×4): 5 mg via ORAL
  Filled 2013-11-01 (×4): qty 1

## 2013-11-01 MED ORDER — ONDANSETRON HCL 4 MG PO TABS: 4.0000 mg | ORAL_TABLET | Freq: Four times a day (QID) | ORAL | Status: DC | PRN

## 2013-11-01 MED ORDER — MORPHINE SULFATE 2 MG/ML IJ SOLN: 2.0000 mg | INTRAMUSCULAR | Status: DC | PRN

## 2013-11-01 MED ORDER — MORPHINE SULFATE 4 MG/ML IJ SOLN
4.0000 mg | Freq: Once | INTRAMUSCULAR | Status: AC
  Administered 2013-11-01: 4 mg via INTRAVENOUS
  Filled 2013-11-01: qty 1

## 2013-11-01 MED ORDER — VERAPAMIL HCL ER 120 MG PO TBCR
120.0000 mg | EXTENDED_RELEASE_TABLET | Freq: Two times a day (BID) | ORAL | Status: DC
  Administered 2013-11-01 – 2013-11-03 (×5): 120 mg via ORAL
  Filled 2013-11-01 (×7): qty 1

## 2013-11-01 MED ORDER — DEXAMETHASONE SODIUM PHOSPHATE 10 MG/ML IJ SOLN
10.0000 mg | Freq: Once | INTRAMUSCULAR | Status: AC
  Administered 2013-11-01: 10 mg via INTRAVENOUS
  Filled 2013-11-01: qty 1

## 2013-11-01 MED ORDER — BENAZEPRIL-HYDROCHLOROTHIAZIDE 20-25 MG PO TABS: 1.0000 | ORAL_TABLET | Freq: Two times a day (BID) | ORAL | Status: DC

## 2013-11-01 MED ORDER — ONDANSETRON HCL 4 MG/2ML IJ SOLN: 4.0000 mg | Freq: Four times a day (QID) | INTRAMUSCULAR | Status: DC | PRN

## 2013-11-01 MED ORDER — PANTOPRAZOLE SODIUM 40 MG PO TBEC
40.0000 mg | DELAYED_RELEASE_TABLET | Freq: Every day | ORAL | Status: DC
  Administered 2013-11-01 – 2013-11-03 (×3): 40 mg via ORAL
  Filled 2013-11-01 (×3): qty 1

## 2013-11-01 MED ORDER — HYDROCHLOROTHIAZIDE 25 MG PO TABS
25.0000 mg | ORAL_TABLET | Freq: Two times a day (BID) | ORAL | Status: DC
  Administered 2013-11-01 – 2013-11-03 (×5): 25 mg via ORAL
  Filled 2013-11-01 (×6): qty 1

## 2013-11-01 MED ORDER — ENOXAPARIN SODIUM 40 MG/0.4ML ~~LOC~~ SOLN
40.0000 mg | SUBCUTANEOUS | Status: DC
  Filled 2013-11-01 (×3): qty 0.4

## 2013-11-01 MED ORDER — FERROUS SULFATE 325 (65 FE) MG PO TABS
325.0000 mg | ORAL_TABLET | Freq: Two times a day (BID) | ORAL | Status: DC
  Administered 2013-11-01 – 2013-11-03 (×5): 325 mg via ORAL
  Filled 2013-11-01 (×6): qty 1

## 2013-11-01 MED ORDER — DEXAMETHASONE 4 MG PO TABS
4.0000 mg | ORAL_TABLET | Freq: Four times a day (QID) | ORAL | Status: DC
  Administered 2013-11-01 – 2013-11-03 (×8): 4 mg via ORAL
  Filled 2013-11-01 (×11): qty 1

## 2013-11-01 MED ORDER — ACETAMINOPHEN 325 MG PO TABS: 650.0000 mg | ORAL_TABLET | Freq: Four times a day (QID) | ORAL | Status: DC | PRN

## 2013-11-01 MED ORDER — DEXAMETHASONE SODIUM PHOSPHATE 4 MG/ML IJ SOLN
4.0000 mg | INTRAMUSCULAR | Status: DC
  Filled 2013-11-01 (×5): qty 1

## 2013-11-01 MED ORDER — ALPRAZOLAM 0.5 MG PO TABS
0.5000 mg | ORAL_TABLET | Freq: Every evening | ORAL | Status: DC | PRN
  Filled 2013-11-01: qty 1

## 2013-11-01 MED ORDER — SENNOSIDES-DOCUSATE SODIUM 8.6-50 MG PO TABS: 1.0000 | ORAL_TABLET | Freq: Every day | ORAL | Status: DC | PRN

## 2013-11-01 NOTE — ED Notes (Signed)
Per EMS: pt coming from home with c/o left arm and left leg for the past two day. Pt has hx of colon CA mets to left shoulder and brain. Pt reports pain to left arm. Pt is A&Ox4, respirations equal and unlabored, skin warm and dry.

## 2013-11-01 NOTE — H&P (Signed)
[facility] History and Physical  Katie [name] [id number] DOB: Mar 27, 1934 DOA: 11/01/2013  Referring physician:  PCP: Elizabeth Palau, MD  Specialists:   Chief Complaint: Left extremity weakness  HPI: Katie [name] is a 80 y.o. female  With a history of hypertension, GERD, metastatic disease that presents to the emergency department for c weakness. Patient is known to have metastatic disease to her brain as well as scapula which is seen on CT as well as x-ray. Patient states that she has been having left shoulder pain for approximately 2 days has had difficulty using her left arm due to the pain. She states the pain has been a 7-8 at 10 and is made worse with movement. She denies any trauma or any recent fall. Of note patient was recently admitted to [facility] and transferred to [facility] January 2015. She was told that she would need radiation however per the patient she decided not to have radiation therapy.  Review of Systems:  Constitutional: Denies fever, chills, diaphoresis, appetite change and fatigue.  HEENT: Denies photophobia, eye pain, redness, hearing loss, ear pain, congestion, sore throat, rhinorrhea, sneezing, mouth sores, trouble swallowing, neck pain, neck stiffness and tinnitus.   Respiratory: Denies SOB, DOE, cough, chest tightness,  and wheezing.   Cardiovascular: Denies chest pain, palpitations and leg swelling.  Gastrointestinal: Denies nausea, vomiting, abdominal pain, diarrhea, constipation, blood in stool and abdominal distention.  Genitourinary: Denies dysuria, urgency, frequency, hematuria, flank pain and difficulty urinating.  Musculoskeletal: Complains of left shoulder pain. Skin: Denies pallor, rash and wound.  Neurological: Denies dizziness, seizures, syncope, weakness, light-headedness, numbness and headaches.  Hematological: Denies adenopathy. Easy bruising, personal or family bleeding history  Psychiatric/Behavioral:  Denies suicidal ideation, mood changes, confusion, nervousness, sleep disturbance and agitation  Past Medical History  Diagnosis Date  . Glaucoma   . Hypertension   . Anxiety   . GERD (gastroesophageal reflux disease)   . Hemorrhoids   . Hyperlipidemia   . Cancer 01/11/13    rectal  . Blood transfusion without reported diagnosis   . History of radiation therapy 01/30/2013-02/17/2013    37.5 gray to the pelvic region   Past Surgical History  Procedure Laterality Date  . Esophagogastroduodenoscopy N/A 01/10/2013    Procedure: ESOPHAGOGASTRODUODENOSCOPY (EGD);  Surgeon: Milus Banister, MD;  Location: [facility];  Service: Endoscopy;  Laterality: N/A;  . Colonoscopy N/A 01/11/2013    Procedure: COLONOSCOPY;  Surgeon: Milus Banister, MD;  Location: [facility];  Service: Endoscopy;  Laterality: N/A;  . Biopsy stomach  01/10/13    mild chronic inflammation, no evidence of malignancy, dysplasia  . Biopsy rectal  01/11/13    proximal mass-invasive adenocarcinoma  . Abdominal hysterectomy  1968    Partial   Social History:  reports that she has never smoked. She has never used smokeless tobacco. She reports that she does not drink alcohol or use illicit drugs.   No Known Allergies  Family History  Problem Relation Age of Onset  . Cancer Sister     rectal  . Hyperlipidemia Sister   . Hypertension Sister   . Stroke Mother   . Hypertension Father   . Stroke Father   . Cancer Brother     prostate, seed implant    Prior to Admission medications   Medication Sig Start Date End Date Taking? Authorizing Provider  Acetaminophen 500 MG coapsule Take 1 capsule by mouth every 6 (six) hours as needed for fever or pain.  Yes Historical Provider, MD  ALPRAZolam Duanne Moron) 0.5 MG tablet Take 1 tablet (0.5 mg total) by mouth at bedtime as needed for sleep. 02/22/13  Yes Ladell Pier, MD  benazepril-hydrochlorthiazide (LOTENSIN HCT) 20-25 MG per tablet Take 1 tablet by mouth 2 (two) times daily.     Yes Historical Provider, MD  dexamethasone (DECADRON) 4 MG tablet Take 4 mg by mouth every 6 (six) hours. 10/12/13  Yes Hosie Poisson, MD  ferrous sulfate 325 (65 FE) MG tablet Take 325 mg by mouth 2 (two) times daily.   Yes Historical Provider, MD  latanoprost (XALATAN) 0.005 % ophthalmic solution Place 1 drop into both eyes at bedtime.    Yes Historical Provider, MD  oxyCODONE (OXY IR/ROXICODONE) 5 MG immediate release tablet Take 1 tablet by mouth every 4 (four) hours as needed for moderate pain or severe pain.  09/04/13  Yes Historical Provider, MD  pantoprazole (PROTONIX) 40 MG tablet Take 40 mg by mouth daily.   Yes Historical Provider, MD  senna-docusate (SENOKOT-S) 8.6-50 MG per tablet Take 1 tablet by mouth daily as needed.   Yes Historical Provider, MD  verapamil (CALAN-SR) 120 MG CR tablet Take 120 mg by mouth 2 (two) times daily.   Yes Historical Provider, MD   Physical Exam: Filed Vitals:   11/01/13 0836  BP: 135/64  Pulse: 79  Temp:   Resp: 18     General: Well developed, well nourished, NAD, appears stated age  HEENT: NCAT, PERRLA, EOMI, Anicteic Sclera, mucous membranes moist. No pharyngeal erythema or exudates  Neck: Supple, no JVD, no masses  Cardiovascular: S1 S2 auscultated, no rubs, murmurs or gallops. Regular rate and rhythm.  Respiratory: Clear to auscultation bilaterally with equal chest rise  Abdomen: Soft, nontender, nondistended, + bowel sounds  Extremities: warm dry without cyanosis clubbing or edema  Neuro: AAOx3, cranial nerves grossly intact. Strength 3/5 LUE, 4/5 LLE, RUE, RLE  Skin: Without rashes exudates or nodules  Psych: Normal affect and demeanor with intact judgement and insight  Labs on Admission:  Basic Metabolic Panel:  Recent Labs Lab 11/01/13 0600  NA 142  K 3.5*  CL 105  CO2 22  GLUCOSE 112*  BUN 6  CREATININE 0.43*  CALCIUM 8.3*   Liver Function Tests:  Recent Labs Lab 11/01/13 0600  AST 17  ALT 13  ALKPHOS 55    BILITOT 0.7  PROT 5.9*  ALBUMIN 2.9*   No results found for this basename: LIPASE, AMYLASE,  in the last 168 hours No results found for this basename: AMMONIA,  in the last 168 hours CBC:  Recent Labs Lab 11/01/13 0600  WBC 3.4*  NEUTROABS 2.7  HGB 12.1  HCT 35.2*  MCV 96.4  PLT 124*   Cardiac Enzymes: No results found for this basename: CKTOTAL, CKMB, CKMBINDEX, TROPONINI,  in the last 168 hours  BNP (last 3 results) No results found for this basename: PROBNP,  in the last 8760 hours CBG: No results found for this basename: GLUCAP,  in the last 168 hours  Radiological Exams on Admission: Ct Head Wo Contrast  11/01/2013   CLINICAL DATA:  Metastatic colon cancer.  Extremity weakness  EXAM: CT HEAD WITHOUT CONTRAST  TECHNIQUE: Contiguous axial images were obtained from the base of the skull through the vertex without intravenous contrast.  COMPARISON:  CT 10/18/2013, MRI 10/09/2013  FINDINGS: Multiple metastatic deposits to the brain are noted. These are best seen on the prior MRI. The largest lesion is in the right  frontal parietal white matter measuring 17 mm. This is slightly hyperdense and unchanged from the prior study. There is a moderate amount of surrounding white matter edema. There is also a metastatic lesion in the high left frontal lobe with surrounding white matter edema. Smaller amounts of edema are present in the left occipital lobe and cerebellum bilaterally related to metastatic disease.  There is generalized atrophy. Chronic microvascular ischemic changes in the white matter. No acute infarct. No acute hemorrhage or midline shift.  IMPRESSION: Multiple metastatic deposits in the brain. There is white matter edema bilaterally which appears to have progressed in the frontal lobes bilaterally. No acute hemorrhage.   Electronically Signed   By: Franchot Gallo M.D.   On: 11/01/2013 07:17   Dg Shoulder Left  11/01/2013   CLINICAL DATA:  Metastatic colon cancer.  Shoulder  pain  EXAM: LEFT SHOULDER - 2+ VIEW  COMPARISON:  03/08/2013  FINDINGS: Large destructive mass of the scapula consistent with metastatic disease. There is significant widening of the acromial humeral distance which may be due to soft tissue tumor.  There is ill-defined hypodensity in the left proximal humerus which could be due to tumor infiltration. There is also sclerosis of the humeral head which could be AVN or relative sparing of tumor.  IMPRESSION: Large destructive mass of the left scapula consistent with metastatic disease. Possible metastatic disease in the proximal humerus.   Electronically Signed   By: Franchot Gallo M.D.   On: 11/01/2013 07:57    EKG: Independently reviewed. Sinus rhythm, rate 92, left axis deviation  Assessment/Plan Active Problems:   Metastatic cancer to brain  Left Shoulder Pain and weakness due to metastasis Patient will be admitted to medical floor. Will make her comfortable and control her pain. Will continue Decadron IV 4 mg every 6 hours. Will also consult oncology. Spoke with patient regarding palliative care consult and goals of care, she is agreeable to this. Will consult palliative care. Will consult PT for evaluation and treatment.  Metastatic disease Possibly GI/Rectal malignancy as primary source. Patient was admitted and transferred to [place] at the beginning of January. She was supposedly told by physicians at [facility] that she needed radiation for her brain metastasis, however she refused. Patient was then sent home. At this time will consult palliative care for further goals of care and options.  Leukopenia and thrombocytopenia, chronic Continue to monitor  Hypokalemia Will place and continue to monitor  Solitary pulmonary nodule  Hypertension Continue verapamil  DVT prophylaxis: Lovenox  Code Status: Full  Condition: Guarded   Family Communication: None at bedside. Admission, patients condition and plan of care including tests being ordered  have been discussed with the patient  who indicates understanding and agrees with the plan and Code Status.  Disposition Plan:   Time spent: [place], Tommy Minichiello D.O. [facility] Pager [phone]  If 7PM-7AM, please contact night-coverage [email or web address] Password [email or web address] 11/01/2013, 10:07 AM

## 2013-11-01 NOTE — Progress Notes (Signed)
Thank you for consulting the Palliative Medicine Team at [facility] to meet your patient's and family's needs.   The reason that you asked [place] to see your patient is  For Clarification of GOC and options  We have scheduled your patient for a meeting: Tomorrow Thursday 11-02-13 at 1pm  The Surrogate decision make is: Son   Katie [name] Contact information: # [phone]  Other family members that need to be present: none  Your patient is able/unable to participate:yes  Katie Lessen NP  Palliative Medicine Team Team Phone # [phone] Pager [phone]

## 2013-11-01 NOTE — ED Provider Notes (Addendum)
I was asked to follow up on this patient by my partner Dr. Roxanne Mins.  Patient has no GI malignancy with extensive metastases. Recently admitted here earlier in the month. Transferred to [facility]. She had CNS metastasis. She was given Decadron. She states that they told her "quite bluntly" during her last admission that there is no more specific/directed therapy that could be offered.  She finished radiation therapy in December. This is apparently to some known metastases in her scapula. Her imaging today shows a right hemispheric metastases with vasogenic edema. Shows probable large joint effusion on the left with bony lesion, or destruction to the proximal humerus, as well as metastases to scapula.  Long discussion with her. We discussed possibility and transferred to [facility]. We discussed staying here for treatment. We discussed outpatient treatment. She is unable to walk due to weakness of her left leg today. She states that her physicians at [facility] had told her there is no more specific therapy that she can be offered. Her strong preference was to stay here in hopes that she might improve. I discussed with her, she is well aware that treatment this point would be palliative. However she states she would like to improve even if we give her a few more days to function at home with her family before she needs more specific care.  Tanna Furry, MD 11/01/13 [place], MD 11/01/13 [phone]

## 2013-11-01 NOTE — ED Provider Notes (Signed)
CSN: [id number]     Arrival date & time 11/01/13  0518 History   First MD Initiated Contact with Patient 11/01/13 [phone]     Chief Complaint  Patient presents with  . Extremity Weakness   (Consider location/radiation/quality/duration/timing/severity/associated sxs/prior Treatment) Patient is a 80 y.o. female presenting with extremity weakness. The history is provided by the patient.  Extremity Weakness  She is complaining of pain in her left shoulder for the last 2 days. During that time, she's had difficulty using her left arm but she thought it was because of pain. She has also noticed that her balance is off and she has to place her left foot rather than just walking naturally. Pain is moderate to severe and she rates it at 7/10. It is worse with movement. She denies pain anywhere else. She denies any trauma. Of note, she is being treated for metastatic colon cancer.  Past Medical History  Diagnosis Date  . Glaucoma   . Hypertension   . Anxiety   . GERD (gastroesophageal reflux disease)   . Hemorrhoids   . Hyperlipidemia   . Cancer 01/11/13    rectal  . Blood transfusion without reported diagnosis   . History of radiation therapy 01/30/2013-02/17/2013    37.5 gray to the pelvic region   Past Surgical History  Procedure Laterality Date  . Esophagogastroduodenoscopy N/A 01/10/2013    Procedure: ESOPHAGOGASTRODUODENOSCOPY (EGD);  Surgeon: Milus Banister, MD;  Location: [facility];  Service: Endoscopy;  Laterality: N/A;  . Colonoscopy N/A 01/11/2013    Procedure: COLONOSCOPY;  Surgeon: Milus Banister, MD;  Location: [facility] ENDOSCOPY;  Service: Endoscopy;  Laterality: N/A;  . Biopsy stomach  01/10/13    mild chronic inflammation, no evidence of malignancy, dysplasia  . Biopsy rectal  01/11/13    proximal mass-invasive adenocarcinoma  . Abdominal hysterectomy  1968    Partial   Family History  Problem Relation Age of Onset  . Cancer Sister     rectal  . Hyperlipidemia Sister   .  Hypertension Sister   . Stroke Mother   . Hypertension Father   . Stroke Father   . Cancer Brother     prostate, seed implant   History  Substance Use Topics  . Smoking status: Never Smoker   . Smokeless tobacco: Never Used  . Alcohol Use: No   OB History   Grav Para Term Preterm Abortions TAB SAB Ect Mult Living                 Review of Systems  Musculoskeletal: Positive for extremity weakness.  All other systems reviewed and are negative.    Allergies  Review of patient's allergies indicates no known allergies.  Home Medications   Current Outpatient Rx  Name  Route  Sig  Dispense  Refill  . ALPRAZolam (XANAX) 0.5 MG tablet   Oral   Take 1 tablet (0.5 mg total) by mouth at bedtime as needed for sleep.   30 tablet   0   . benazepril-hydrochlorthiazide (LOTENSIN HCT) 20-25 MG per tablet   Oral   Take 1 tablet by mouth 2 (two) times daily.          Marland Kitchen dexamethasone (DECADRON) 4 MG tablet   Oral   Take 4 mg by mouth every 6 (six) hours.         . ferrous sulfate 325 (65 FE) MG tablet   Oral   Take 325 mg by mouth 2 (two) times daily.         Marland Kitchen  latanoprost (XALATAN) 0.005 % ophthalmic solution   Both Eyes   Place 1 drop into both eyes at bedtime.          . pantoprazole (PROTONIX) 40 MG tablet   Oral   Take 40 mg by mouth daily.         Marland Kitchen senna-docusate (SENOKOT-S) 8.6-50 MG per tablet   Oral   Take 1 tablet by mouth daily as needed.         . valACYclovir (VALTREX) 1000 MG tablet   Oral   Take 1,000 mg by mouth 3 (three) times daily.         . verapamil (CALAN-SR) 120 MG CR tablet   Oral   Take 120 mg by mouth 2 (two) times daily.          BP 152/80  Pulse 92  Temp(Src) 98.5 F (36.9 C) (Oral)  Resp 16  Ht 5\' 2"  (1.575 m)  Wt 165 lb (74.844 kg)  BMI 30.17 kg/m2  SpO2 99% Physical Exam  Nursing note and vitals reviewed.  80 year old female, resting comfortably and in no acute distress. Vital signs are significant for  hypertension with blood pressure 152/92. Oxygen saturation is 99%, which is normal. Head is normocephalic and atraumatic. PERRLA, EOMI. Oropharynx is clear. Fundi show no hemorrhage, exudate, or papilledema. Neck is nontender and supple without adenopathy or JVD. There no carotid bruits. Back is nontender and there is no CVA tenderness. Lungs are clear without rales, wheezes, or rhonchi. Chest is nontender. Heart has regular rate and rhythm without murmur. Abdomen is soft, flat, nontender without masses or hepatosplenomegaly and peristalsis is normoactive. Extremities: There is moderate soft tissue swelling of the proximal portion of the left upper arm extending into the shoulder and upper chest wall. There is no definite ecchymoses. This area is tender to palpation and there is pain on passive range of motion. Distal neurovascular exam is intact with strong pulses, prompt capillary refill, normal sensation. Remainder of extremity exam is unremarkable with no peripheral edema. Skin is warm and dry without rash. Neurologic: Mental status is normal, cranial nerves are intact. There is moderate weakness of the left arm and left leg. Left hand grip is approximately 3/5 in strength, and left leg strength is 3.5/5. There is no Babinski response. Cerebellar testing is not able to be done.  ED Course  Procedures (including critical care time) Labs Review Results for orders placed during the hospital encounter of 11/01/13  CBC WITH DIFFERENTIAL      Result Value Range   WBC 3.4 (*) 4.0 - 10.5 K/uL   RBC 3.65 (*) 3.87 - 5.11 MIL/uL   Hemoglobin 12.1  12.0 - 15.0 g/dL   HCT 35.2 (*) 36.0 - 46.0 %   MCV 96.4  78.0 - 100.0 fL   MCH 33.2  26.0 - 34.0 pg   MCHC 34.4  30.0 - 36.0 g/dL   RDW 14.3  11.5 - 15.5 %   Platelets 124 (*) 150 - 400 K/uL   Neutrophils Relative % 79 (*) 43 - 77 %   Neutro Abs 2.7  1.7 - 7.7 K/uL   Lymphocytes Relative 10 (*) 12 - 46 %   Lymphs Abs 0.3 (*) 0.7 - 4.0 K/uL    Monocytes Relative 10  3 - 12 %   Monocytes Absolute 0.3  0.1 - 1.0 K/uL   Eosinophils Relative 2  0 - 5 %   Eosinophils Absolute 0.1  0.0 - 0.7 K/uL  Basophils Relative 0  0 - 1 %   Basophils Absolute 0.0  0.0 - 0.1 K/uL  COMPREHENSIVE METABOLIC PANEL      Result Value Range   Sodium 142  137 - 147 mEq/L   Potassium 3.5 (*) 3.7 - 5.3 mEq/L   Chloride 105  96 - 112 mEq/L   CO2 22  19 - 32 mEq/L   Glucose, Bld 112 (*) 70 - 99 mg/dL   BUN 6  6 - 23 mg/dL   Creatinine, Ser 0.43 (*) 0.50 - 1.10 mg/dL   Calcium 8.3 (*) 8.4 - 10.5 mg/dL   Total Protein 5.9 (*) 6.0 - 8.3 g/dL   Albumin 2.9 (*) 3.5 - 5.2 g/dL   AST 17  0 - 37 U/L   ALT 13  0 - 35 U/L   Alkaline Phosphatase 55  39 - 117 U/L   Total Bilirubin 0.7  0.3 - 1.2 mg/dL   GFR calc non Af Amer >90  >90 mL/min   GFR calc Af Amer >90  >90 mL/min  CBC      Result Value Range   WBC 3.3 (*) 4.0 - 10.5 K/uL   RBC 3.96  3.87 - 5.11 MIL/uL   Hemoglobin 13.4  12.0 - 15.0 g/dL   HCT 38.1  36.0 - 46.0 %   MCV 96.2  78.0 - 100.0 fL   MCH 33.8  26.0 - 34.0 pg   MCHC 35.2  30.0 - 36.0 g/dL   RDW 14.2  11.5 - 15.5 %   Platelets 152  150 - 400 K/uL  CREATININE, SERUM      Result Value Range   Creatinine, Ser 0.48 (*) 0.50 - 1.10 mg/dL   GFR calc non Af Amer >90  >90 mL/min   GFR calc Af Amer >90  >90 mL/min   Dg Chest 2 View  10/10/2013   CLINICAL DATA:  Stroke symptoms, known metastatic malignancy  EXAM: CHEST  2 VIEW  COMPARISON:  CT scan of the chest dated Mar 01, 2013.  FINDINGS: The lungs are adequately inflated. Innumerable pulmonary nodules measuring less than 1 cm in size are demonstrated bilaterally. There is no alveolar infiltrate. The cardiac silhouette is normal in size. There is tortuosity of the descending thoracic aorta. A Port-A-Cath appliance is in place with its tip in the region of the distal SVC. There is no pleural effusion or pneumothorax. The observed portions of the bony thorax exhibit destructive change of the left  scapula.  IMPRESSION: 1. Widespread pulmonary metastatic nodules are present. 2. There is no evidence of pneumonia nor CHF nor atelectasis. 3. Destructive change of the scapula is nearly complete.   Electronically Signed   By: Bethanie Bloxom  [place]   On: 10/10/2013 18:23   Ct Head Wo Contrast  11/01/2013   CLINICAL DATA:  Metastatic colon cancer.  Extremity weakness  EXAM: CT HEAD WITHOUT CONTRAST  TECHNIQUE: Contiguous axial images were obtained from the base of the skull through the vertex without intravenous contrast.  COMPARISON:  CT 10/18/2013, MRI 10/09/2013  FINDINGS: Multiple metastatic deposits to the brain are noted. These are best seen on the prior MRI. The largest lesion is in the right frontal parietal white matter measuring 17 mm. This is slightly hyperdense and unchanged from the prior study. There is a moderate amount of surrounding white matter edema. There is also a metastatic lesion in the high left frontal lobe with surrounding white matter edema. Smaller amounts of edema are present in  the left occipital lobe and cerebellum bilaterally related to metastatic disease.  There is generalized atrophy. Chronic microvascular ischemic changes in the white matter. No acute infarct. No acute hemorrhage or midline shift.  IMPRESSION: Multiple metastatic deposits in the brain. There is white matter edema bilaterally which appears to have progressed in the frontal lobes bilaterally. No acute hemorrhage.   Electronically Signed   By: Franchot Gallo M.D.   On: 11/01/2013 07:17   Ct Head Wo Contrast  10/18/2013   CLINICAL DATA:  Dizziness, history of metastatic colorectal cancer with brain metastasis.  EXAM: CT HEAD WITHOUT CONTRAST  TECHNIQUE: Contiguous axial images were obtained from the base of the skull through the vertex without intravenous contrast.  COMPARISON:  CT of the head October 09, 2013. MRI of the brain October 09, 2013.  FINDINGS: Mild vasogenic edema and right cerebellum, right centrum semiovale  and left greater than right bifrontal lobes associated with patient's mid metastatic disease better seen on prior MRI. 11 mm hyperdensity in the right centrum semiovale is relatively unchanged. No midline shift.  Ventricles and sulci are normal for age. Similar patchy supratentorial white matter hypodensities, no intraparenchymal hemorrhage.  No abnormal extra-axial fluid collections. Mild calcific atherosclerosis of the carotid siphons. Remote left medial orbital blowout fracture. No skull fracture. Ocular globes and orbital contents are nonsuspicious.  IMPRESSION: Stable appearance of the head: Brain metastasis better seen on MRI of the brain March 10, 2014 ; no acute intracranial process.   Electronically Signed   By: Elon Alas   On: 10/18/2013 02:54   Ct Head (brain) Wo Contrast  10/09/2013   CLINICAL DATA:  Left arm and leg weakness.  EXAM: CT HEAD WITHOUT CONTRAST  TECHNIQUE: Contiguous axial images were obtained from the base of the skull through the vertex without contrast.  COMPARISON:  None  FINDINGS: There is a 1.0 x 1.1 cm hyperdense lesion in the right parietal white matter just above the right lateral ventricle. There is vasogenic edema around this hyperdense focus. Findings are consistent with a small intraparenchymal hemorrhage or lesion. Patient does have underlying white matter changes but there is also vasogenic edema in the left frontal lobe near the vertex and concern for a small hyperdense lesion in the left frontal-parietal vertex on image number 27. There is also concern for a small area of edema in the left temporal lobe on image number 11.  There is no evidence for midline shift or significant hydrocephalus. There is deformity of the medial left orbital wall consistent with old injury. No acute bone abnormality.  IMPRESSION: There is a 1.1 cm hyperdense focus in the right parietal white matter with surrounding vasogenic edema. This could represent a hyperdense lesion or small  hemorrhage. There is also concern for vasogenic edema and a hyperdense lesion near the left vertex and focal edema in the left temporal lobe. Findings raise concern for multiple lesions and metastatic disease. Recommend further characterization with a brain MRI (with and without contrast).  Critical Value/emergent results were called by telephone at the time of interpretation on 10/09/2013 at 6:13 PM to Dr. Joseph Berkshire , who verbally acknowledged these results.   Electronically Signed   By: Markus Daft M.D.   On: 10/09/2013 18:18   Mr Jeri Cos [id number] Contrast  10/09/2013   CLINICAL DATA:  Rectal cancer, gait imbalance, possible intracranial lesions.  EXAM: MRI HEAD WITHOUT AND WITH CONTRAST  TECHNIQUE: Multiplanar, multiecho pulse sequences of the brain and surrounding structures were obtained  without and with intravenous contrast.  CONTRAST:  44mL MULTIHANCE GADOBENATE DIMEGLUMINE 529 MG/ML IV SOLN  COMPARISON:  CT of the head October 09, 2013 at 1751 hr  FINDINGS: At least 7 infratentorial and 5 supratentorial intraparenchymal enhancing metastasis seen. (dominant right inferior 15 x 12 mm lesion ; including 7 x 5 mm mesial left temporal, 7 x 6 mm left temporal, 12 x 11 mm right posterior frontal, 9 x 13 mm left posterior frontal lesions). Lesions show low T2 signal, and surrounding T2 bright vasogenic edema. Subcentimeter enhancing focus along the anterior inferior falx/ right anterior cranial fossa may reflect meningioma as there is hyperostosis on prior head CT. No midline shift. No reduced diffusion to suggest acute ischemia. No suspicious intraparenchymal intrinsic T1 shortening to suggest blood products.  Moderate ventriculomegaly, likely on the basis of global parenchymal brain volume loss as there is commensurate enlargement of cerebral sulci and cerebellar folia. Patchy supratentorial white matter T2 hyperintensities, exclusive of the vasogenic edema suggests moderate chronic small vessel ischemic  disease. No susceptibility artifact to suggest hemorrhage.  No abnormal extra-axial fluid collections, or leptomeningeal enhancement. Major intracranial vascular flow voids observed at the skull base, with mild the local ectatic appearance which may reflect chronic hypertension.  Ocular globes and orbital contents are nonsuspicious though not tailored for evaluation. Remote left medial orbital blowout fracture. The paranasal sinuses and mastoid air cells appear well-aerated. At least mild temporomandibular osteoarthrosis. No abnormal sellar expansion. Craniocervical junction maintained. No definite abnormal calvarial bone marrow signal.  IMPRESSION: At least 7 infratentorial and 5 supratentorial enhancing lesions highly concerning for metastatic disease, the largest lesion within right inferior cerebellum measuring 15 x 12 mm. Associated vasogenic edema without midline shift. No intracranial hemorrhage or hemorrhagic lesions.  Subcentimeter right parafalcine/anterior cranial fossa extra-axial enhancing lesion likely reflects a meningioma.  Involutional changes. Moderate white matter changes suggest chronic small vessel ischemic disease.   Electronically Signed   By: Elon Alas   On: 10/09/2013 22:47   Dg Shoulder Left  11/01/2013   CLINICAL DATA:  Metastatic colon cancer.  Shoulder pain  EXAM: LEFT SHOULDER - 2+ VIEW  COMPARISON:  03/08/2013  FINDINGS: Large destructive mass of the scapula consistent with metastatic disease. There is significant widening of the acromial humeral distance which may be due to soft tissue tumor.  There is ill-defined hypodensity in the left proximal humerus which could be due to tumor infiltration. There is also sclerosis of the humeral head which could be AVN or relative sparing of tumor.  IMPRESSION: Large destructive mass of the left scapula consistent with metastatic disease. Possible metastatic disease in the proximal humerus.   Electronically Signed   By: Franchot Gallo  M.D.   On: 11/01/2013 07:57   Mr Jodene Nam Head/brain Wo Cm  10/10/2013   CLINICAL DATA:  Stroke. Left arm and leg weakness with grip strength weakness and change in gait for 2 days. Metastatic rectal cancer.  EXAM: MRA HEAD WITHOUT CONTRAST  TECHNIQUE: Angiographic images of the Circle of Willis were obtained using MRA technique without intravenous contrast.  COMPARISON:  None.  FINDINGS: Visualized distal vertebral arteries are patent. Left vertebral artery is dominant. There is mild irregularity and mild narrowing of the distal right vertebral artery. PICA origins are not clearly identified. Basilar artery is patent without evidence of significant stenosis. SCAs are patent. There is a fetal origin of the left PCA. A right posterior communicating artery is also present. There is mild irregularity of the PCAs bilaterally  with a mild to moderate focal stenosis involving the midportion of the right P2 segment.  Internal carotid arteries are patent from skullbase to carotid terminus. ACA origins are patent. Left A1 segment is hypoplastic. Anterior communicating artery is patent and is the dominant supply for the more distal left ACA. MCA origins and visualized branches are patent without evidence of flow-limiting stenosis. No intracranial aneurysm is identified.  IMPRESSION: 1. No evidence of major intracranial arterial occlusion. 2. Mild to moderate focal stenosis involving the midportion of the right P2 segment with mild irregularity of the PCAs bilaterally, suggestive of atherosclerosis. 3. No evidence of flow limiting stenosis in the anterior circulation.   Electronically Signed   By: Logan Bores   On: 10/10/2013 19:39    Imaging Review Ct Head Wo Contrast  11/01/2013   CLINICAL DATA:  Metastatic colon cancer.  Extremity weakness  EXAM: CT HEAD WITHOUT CONTRAST  TECHNIQUE: Contiguous axial images were obtained from the base of the skull through the vertex without intravenous contrast.  COMPARISON:  CT  10/18/2013, MRI 10/09/2013  FINDINGS: Multiple metastatic deposits to the brain are noted. These are best seen on the prior MRI. The largest lesion is in the right frontal parietal white matter measuring 17 mm. This is slightly hyperdense and unchanged from the prior study. There is a moderate amount of surrounding white matter edema. There is also a metastatic lesion in the high left frontal lobe with surrounding white matter edema. Smaller amounts of edema are present in the left occipital lobe and cerebellum bilaterally related to metastatic disease.  There is generalized atrophy. Chronic microvascular ischemic changes in the white matter. No acute infarct. No acute hemorrhage or midline shift.  IMPRESSION: Multiple metastatic deposits in the brain. There is white matter edema bilaterally which appears to have progressed in the frontal lobes bilaterally. No acute hemorrhage.   Electronically Signed   By: Franchot Gallo M.D.   On: 11/01/2013 07:17   Dg Shoulder Left  11/01/2013   CLINICAL DATA:  Metastatic colon cancer.  Shoulder pain  EXAM: LEFT SHOULDER - 2+ VIEW  COMPARISON:  03/08/2013  FINDINGS: Large destructive mass of the scapula consistent with metastatic disease. There is significant widening of the acromial humeral distance which may be due to soft tissue tumor.  There is ill-defined hypodensity in the left proximal humerus which could be due to tumor infiltration. There is also sclerosis of the humeral head which could be AVN or relative sparing of tumor.  IMPRESSION: Large destructive mass of the left scapula consistent with metastatic disease. Possible metastatic disease in the proximal humerus.   Electronically Signed   By: Franchot Gallo M.D.   On: 11/01/2013 07:57    EKG Interpretation    Date/Time:    Ventricular Rate:    PR Interval:    QRS Duration:   QT Interval:    QTC Calculation:   R Axis:     Text Interpretation:              MDM   1. Cerebral edema   2.  Metastatic cancer   3. Anemia   4. Cancer   5. Hypokalemia   6. Leukocytopenia, unspecified   7. Metastasis to brain    Left shoulder pain and swelling of uncertain cause. X-rays be obtained to rule out fracture. Left arm and leg weakness no which could be related to a stroke. However, review of old records shows that she had an MRI showing multiple brain metastases  and this is far more likely to be related to her known metastatic disease than a stroke. In any case, symptoms began more than 2 days ago so she is well outside the window for code stroke and thrombolytic intervention. Case is signed out to Dr. Jeneen Rinks to evaluate radiology studies.    Delora Fuel, MD [id number]

## 2013-11-01 NOTE — ED Notes (Signed)
Patient helped on the bedpan. Patient comfortable and resting in the dark. Patient made aware of waiting for a bed assignment upstairs.

## 2013-11-01 NOTE — ED Notes (Addendum)
Patient returned from [place]. Patient placed back on the monitor. Patient resting.

## 2013-11-01 NOTE — Progress Notes (Signed)
Patient refused to take lovenox. It was explained to patient why it was prescribed but she still refused to take. Notified Dr. Ree Kida of patient's refusal to take lovenox.

## 2013-11-01 NOTE — ED Notes (Signed)
Hospitalist at the bedside 

## 2013-11-01 NOTE — ED Notes (Signed)
EDP at the bedside with pt.

## 2013-11-01 NOTE — ED Notes (Signed)
MD at bedside. 

## 2013-11-01 NOTE — ED Notes (Signed)
Pt eating lunch tray. No complaints at this time.

## 2013-11-01 NOTE — ED Notes (Signed)
Patient transported to X-ray 

## 2013-11-01 NOTE — ED Notes (Signed)
Ordered lunch tray 

## 2013-11-02 DIAGNOSIS — G893 Neoplasm related pain (acute) (chronic): Secondary | ICD-10-CM

## 2013-11-02 DIAGNOSIS — Z515 Encounter for palliative care: Secondary | ICD-10-CM

## 2013-11-02 DIAGNOSIS — R911 Solitary pulmonary nodule: Secondary | ICD-10-CM

## 2013-11-02 DIAGNOSIS — G936 Cerebral edema: Secondary | ICD-10-CM

## 2013-11-02 DIAGNOSIS — M6281 Muscle weakness (generalized): Secondary | ICD-10-CM

## 2013-11-02 LAB — BASIC METABOLIC PANEL
BUN: 9 mg/dL (ref 6–23)
CALCIUM: 9.1 mg/dL (ref 8.4–10.5)
CO2: 23 mEq/L (ref 19–32)
CREATININE: 0.45 mg/dL — AB (ref 0.50–1.10)
Chloride: 103 mEq/L (ref 96–112)
GFR calc Af Amer: >90 mL/min (ref >90)
GLUCOSE: 194 mg/dL — AB (ref 70–99)
Potassium: 3.6 mEq/L — ABNORMAL LOW (ref 3.7–5.3)
SODIUM: 140 meq/L (ref 137–147)

## 2013-11-02 MED ORDER — POTASSIUM CHLORIDE CRYS ER 20 MEQ PO TBCR
40.0000 meq | EXTENDED_RELEASE_TABLET | Freq: Once | ORAL | Status: AC
  Administered 2013-11-02: 40 meq via ORAL
  Filled 2013-11-02: qty 2

## 2013-11-02 NOTE — Progress Notes (Signed)
Utilization review completed. Benancio Osmundson, RN, BSN. 

## 2013-11-02 NOTE — Progress Notes (Signed)
Checked on patient to discuss hospice services.  Pt sleeping.  No family in room. Noted that pt's son, Katie [name], listed as primary contact. Called him and he said that there are numerous family members to assist him and that he already has her bed ready.  He feels only DME needed for her will be a wheelchair as she already has a BSC and walker at home. Plans made for pt and son to discuss home hospice needs and for me to follow up with them tomorrow to make arrangements for her discharge.

## 2013-11-02 NOTE — Progress Notes (Signed)
PT Cancellation Note  Patient Details Name: Katie [name] MRN: [id number] DOB: 03/17/1934   Cancelled Treatment:    Reason Eval/Treat Not Completed: Pain limiting ability to participate (Pt declined PT now secondary to just getting back to bed.  Pt also states she just took pain meds and would like to wait until those are working.  Will check back later with pt. She does state she anticipates possibly going home tomorrow.)   Melvern Banker 11/02/2013, 10:44 AM Lavonia Dana, PT  [phone] 11/02/2013

## 2013-11-02 NOTE — Consult Note (Signed)
Patient Katie [name]      DOB: 06-Jan-1934      DDU:[id number]     Consult Note from the Palliative Medicine Team at [facility] Requested by:  Dr Ree Kida     PCP: Elizabeth Palau, MD Reason for Consultation:Clarifcaition of [place] and options     Phone Number:[phone]  Assessment of patients Current state:  80 yo black female with a history of hypertension, GERD, metastatic colorectal cancer disease that presents to the emergency department with weakness. Patient is known to have metastatic disease to her brain as well as scapula which is seen on CT as well as x-ray. Patient states that she has been having left shoulder pain for approximately 2 days has had difficulty using her left arm due to the pain. She states the pain has been a 7-8 at 10 and is made worse with movement. She denies any trauma or any recent fall. Of note patient was recently admitted to [facility] and transferred to [facility] January 2015. She was told that she would need radiation however per the patient she decided not to have radiation therapy.   Today patient tells me she "knows" she will die from her cancer, "there is no cure".  She is open to hospice services in her home.  She tells me she does not want to go back to she her oncologist any more or to have any more radiation  However at this time she does not want to document any advanced directive    This NP Wadie Lessen reviewed medical records, received report from team, assessed the patient and then meet at the patient's bedside along with her son  Dyke Brackett and her brother Sharai Overbay to discuss diagnosis prognosis, [place], EOL wishes disposition and options.   A detailed discussion was had today regarding advanced directives.  Concepts specific to code status, artifical feeding and hydration, continued IV antibiotics and rehospitalization was had.  The difference between a aggressive medical intervention path  and a  palliative comfort care path for this patient at this time was had.  Values and goals of care important to patient and family were attempted to be elicited.  Concept of Hospice and Palliative Care were discussed  Natural trajectory and expectations at EOL were discussed.  Questions and concerns addressed.  Hard Choices booklet left for review. Family encouraged to call with questions or concerns.  PMT will continue to support holistically.   Goals of Care: 1.  Code Status:  Full   2. Scope of Treatment:  At this time she is unable to document any advanced directive.  She tells me her son will know what to do when the time comes.  A MOST form is left with her and her family at bedsdie.   4. Disposition:  Home with hospice services, will write for choice   3. Symptom Management:   1. Pain-skeletal/metastatic-   Oxycodone 5 mg PO every 4 hrs prn  4. Psychosocial:  Emotional support offered to patient and family at bedside.  Patient tells me and her family agrees she will "know what to do when the time is right".  Her son Mariea Clonts will act in her behalf when that time comes.    Patient Documents Completed or Given: Document Given Completed  Advanced Directives Pkt    MOST yes   DNR    Gone from My Sight    Hard Choices yes     Brief CBJ:SEGB  a history of hypertension, GERD, metastatic disease that presents to the emergency department for c weakness. Patient is known to have metastatic disease to her brain as well as scapula which is seen on CT as well as x-ray. Patient states that she has been having left shoulder pain for approximately 2 days has had difficulty using her left arm due to the pain. She states the pain has been a 7-8 at 10 and is made worse with movement. She denies any trauma or any recent fall. Of note patient was recently admitted to [facility] and transferred to [facility] January 2015. She was told that she would need radiation however per the  patient she decided not to have radiation therapy.   ROS:  left shoulder pain, weakness    PMH:  Past Medical History  Diagnosis Date  . Glaucoma   . Hypertension   . Anxiety   . GERD (gastroesophageal reflux disease)   . Hemorrhoids   . Hyperlipidemia   . Blood transfusion without reported diagnosis   . History of radiation therapy 01/30/2013-02/17/2013    37.5 gray to the pelvic region  . Cancer 01/11/13    rectal  . Metastasis to brain      PSH: Past Surgical History  Procedure Laterality Date  . Esophagogastroduodenoscopy N/A 01/10/2013    Procedure: ESOPHAGOGASTRODUODENOSCOPY (EGD);  Surgeon: Milus Banister, MD;  Location: [facility];  Service: Endoscopy;  Laterality: N/A;  . Colonoscopy N/A 01/11/2013    Procedure: COLONOSCOPY;  Surgeon: Milus Banister, MD;  Location: [facility] ENDOSCOPY;  Service: Endoscopy;  Laterality: N/A;  . Biopsy stomach  01/10/13    mild chronic inflammation, no evidence of malignancy, dysplasia  . Biopsy rectal  01/11/13    proximal mass-invasive adenocarcinoma  . Abdominal hysterectomy  1968    Partial   I have reviewed the [place] and SH and  If appropriate update it with new information. No Known Allergies Scheduled Meds: . benazepril  20 mg Oral BID   And  . hydrochlorothiazide  25 mg Oral BID  . dexamethasone  4 mg Oral Q6H  . enoxaparin (LOVENOX) injection  40 mg Subcutaneous Q24H  . ferrous sulfate  325 mg Oral BID  . pantoprazole  40 mg Oral Daily  . verapamil  120 mg Oral BID   Continuous Infusions: . sodium chloride 75 mL/hr (11/02/13 0511)   PRN Meds:.acetaminophen, acetaminophen, ALPRAZolam, morphine injection, ondansetron (ZOFRAN) IV, ondansetron, oxyCODONE, senna-docusate    BP 107/50  Pulse 74  Temp(Src) 97.9 F (36.6 C) (Oral)  Resp 20  Ht 5\' 2"  (1.575 m)  Wt 72.9 kg (160 lb 11.5 oz)  BMI 29.39 kg/m2  SpO2 95%   PPS: 30 %    Intake/Output Summary (Last 24 hours) at 11/02/13 1330 Last data filed at 11/02/13 1610  Gross  per 24 hour  Intake 1433.75 ml  Output   1050 ml  Net 383.75 ml    Physical Exam:  General: chronically ill appearing, OOB to chair, NAD HEENT:  Mm, no exudate Chest:   CTA CVS: RRR Abdomen:soft NT +BS Ext: without edema Neuro:alert and oriented X3 Psych; easily agitated with any conversation regarding her health care and [place] (family reports this is her baseline, "she is very controlling")  Labs: CBC    Component Value Date/Time   WBC 3.3* 11/01/2013 1546   WBC 4.9 02/15/2013 1353   RBC 3.96 11/01/2013 1546   RBC 3.61* 02/15/2013 1353   RBC 2.58* 01/09/2013 1104  HGB 13.4 11/01/2013 1546   HGB 11.0* 02/15/2013 1353   HCT 38.1 11/01/2013 1546   HCT 33.4* 02/15/2013 1353   PLT 152 11/01/2013 1546   PLT 219 02/15/2013 1353   MCV 96.2 11/01/2013 1546   MCV 92.5 02/15/2013 1353   MCH 33.8 11/01/2013 1546   MCH 30.5 02/15/2013 1353   MCHC 35.2 11/01/2013 1546   MCHC 32.9 02/15/2013 1353   RDW 14.2 11/01/2013 1546   RDW 16.8* 02/15/2013 1353   LYMPHSABS 0.3* 11/01/2013 0600   LYMPHSABS 0.8* 02/15/2013 1353   MONOABS 0.3 11/01/2013 0600   MONOABS 0.4 02/15/2013 1353   EOSABS 0.1 11/01/2013 0600   EOSABS 0.3 02/15/2013 1353   BASOSABS 0.0 11/01/2013 0600   BASOSABS 0.0 02/15/2013 1353    BMET    Component Value Date/Time   NA 140 11/02/2013 0900   NA 141 01/25/2013 0854   K 3.6* 11/02/2013 0900   K 3.7 01/25/2013 0854   CL 103 11/02/2013 0900   CL 104 01/25/2013 0854   CO2 23 11/02/2013 0900   CO2 28 01/25/2013 0854   GLUCOSE 194* 11/02/2013 0900   GLUCOSE 97 01/25/2013 0854   BUN 9 11/02/2013 0900   BUN 16.3 01/25/2013 0854   CREATININE 0.45* 11/02/2013 0900   CREATININE 0.8 01/25/2013 0854   CALCIUM 9.1 11/02/2013 0900   CALCIUM 9.2 01/25/2013 0854   GFRNONAA >90 11/02/2013 0900   GFRAA >90 11/02/2013 0900    CMP     Component Value Date/Time   NA 140 11/02/2013 0900   NA 141 01/25/2013 0854   K 3.6* 11/02/2013 0900   K 3.7 01/25/2013 0854   CL 103 11/02/2013 0900   CL 104 01/25/2013 0854    CO2 23 11/02/2013 0900   CO2 28 01/25/2013 0854   GLUCOSE 194* 11/02/2013 0900   GLUCOSE 97 01/25/2013 0854   BUN 9 11/02/2013 0900   BUN 16.3 01/25/2013 0854   CREATININE 0.45* 11/02/2013 0900   CREATININE 0.8 01/25/2013 0854   CALCIUM 9.1 11/02/2013 0900   CALCIUM 9.2 01/25/2013 0854   PROT 5.9* 11/01/2013 0600   ALBUMIN 2.9* 11/01/2013 0600   AST 17 11/01/2013 0600   ALT 13 11/01/2013 0600   ALKPHOS 55 11/01/2013 0600   BILITOT 0.7 11/01/2013 0600   GFRNONAA >90 11/02/2013 0900   GFRAA >90 11/02/2013 0900       Time In Time Out Total Time Spent with Patient Total Overall Time  1300 1420 75 min 80 min    Greater than 50%  of this time was spent counseling and coordinating care related to the above assessment and plan.  Wadie Lessen NP  Palliative Medicine Team Team Phone # [phone] Pager [phone]  Discussed with Dr Ree Kida

## 2013-11-02 NOTE — Progress Notes (Signed)
[facility]                                                                              Patient Demographics  Katie [name], is a 80 y.o. female, DOB - [birth year], VEL:[id number]  Admit date - 11/01/2013   Admitting Physician Cristal Ford, DO  Outpatient Primary MD for the patient is Elizabeth Palau, MD  LOS - 1   Chief Complaint  Patient presents with  . Extremity Weakness        Assessment & Plan    Active Problems:   Metastatic cancer to brain  Left Shoulder Pain and weakness due to metastasis  -Continue pain control and decadron 4mg  q6h -palliative meeting later today -PT consulted  Metastatic disease  -Possibly GI/Rectal malignancy as primary source.  -Patient was admitted and transferred to [facility] at the beginning of January. She was supposedly told by physicians at [facility] that she needed radiation for her brain metastasis, however she refused.  -palliative care meeting today to discuss goals of care  Leukopenia and thrombocytopenia, chronic  Continue to monitor   Hypokalemia  Will replace and continue to monitor   Solitary pulmonary nodule   Hypertension  Continue verapamil   Code Status: Full  Family Communication: None at bedside.  Disposition Plan: Admitted.  Palliative care meeting at 1pm today.  Time Spent in minutes   25 minutes  Procedures None  Consults  Palliative Care  DVT Prophylaxis  SCDs, refused Lovenox  Lab Results  Component Value Date   PLT 152 11/01/2013    Medications  Scheduled Meds: . benazepril  20 mg Oral BID   And  . hydrochlorothiazide  25 mg Oral BID  . dexamethasone  4 mg Oral Q6H  . enoxaparin (LOVENOX) injection  40 mg Subcutaneous Q24H  . ferrous sulfate  325 mg Oral BID  . pantoprazole  40 mg Oral Daily  . verapamil  120 mg Oral BID   Continuous Infusions: . sodium chloride 75 mL/hr (11/02/13 0511)   PRN Meds:.acetaminophen, acetaminophen, ALPRAZolam, morphine injection, ondansetron  (ZOFRAN) IV, ondansetron, oxyCODONE, senna-docusate  Antibiotics    Anti-infectives   None        Subjective:   Katie [name] seen and examined today.  Patient still complains of pain in her left arm.    Objective:   Filed Vitals:   11/01/13 1315 11/01/13 1411 11/01/13 2154 11/02/13 0500  BP:  148/84 117/71 127/73  Pulse: 111 99 71 74  Temp:  97.8 F (36.6 C) 97.8 F (36.6 C) 97.9 F (36.6 C)  TempSrc:  Oral Oral Oral  Resp: 17 18 18 20   Height:  5\' 2"  (1.575 m)    Weight:  72.9 kg (160 lb 11.5 oz)    SpO2: 98% 97% 100% 95%    Wt Readings from Last 3 Encounters:  11/01/13 72.9 kg (160 lb 11.5 oz)  10/18/13 76.658 kg (169 lb)  10/09/13 78.019 kg (172 lb)     Intake/Output Summary (Last 24 hours) at 11/02/13 0823 Last data filed at 11/01/13 2200  Gross per 24 hour  Intake 1313.75 ml  Output   1050 ml  Net 263.75 ml   Exam General: Well  developed, well nourished, NAD, appears stated age  61: NCAT, PERRLA, EOMI, Anicteic Sclera, mucous membranes moist. No pharyngeal erythema or exudates  Neck: Supple, no JVD, no masses  Cardiovascular: S1 S2 auscultated, no rubs, murmurs or gallops. Regular rate and rhythm.  Respiratory: Clear to auscultation bilaterally with equal chest rise  Abdomen: Soft, nontender, nondistended, + bowel sounds  Extremities: warm dry without cyanosis clubbing or edema  Neuro: AAOx3, cranial nerves grossly intact. Strength 3/5 LUE, 4/5 LLE, RUE, RLE  Skin: Without rashes exudates or nodules  Psych: Normal affect and demeanor with intact judgement and insight   Data Review   Micro Results No results found for this or any previous visit (from the past 240 hour(s)).  Radiology Reports Dg Chest 2 View  10/10/2013   CLINICAL DATA:  Stroke symptoms, known metastatic malignancy  EXAM: CHEST  2 VIEW  COMPARISON:  CT scan of the chest dated Mar 01, 2013.  FINDINGS: The lungs are adequately inflated. Innumerable pulmonary nodules measuring less  than 1 cm in size are demonstrated bilaterally. There is no alveolar infiltrate. The cardiac silhouette is normal in size. There is tortuosity of the descending thoracic aorta. A Port-A-Cath appliance is in place with its tip in the region of the distal SVC. There is no pleural effusion or pneumothorax. The observed portions of the bony thorax exhibit destructive change of the left scapula.  IMPRESSION: 1. Widespread pulmonary metastatic nodules are present. 2. There is no evidence of pneumonia nor CHF nor atelectasis. 3. Destructive change of the scapula is nearly complete.   Electronically Signed   By: David  Martinique   On: 10/10/2013 18:23   Ct Head Wo Contrast  11/01/2013   CLINICAL DATA:  Metastatic colon cancer.  Extremity weakness  EXAM: CT HEAD WITHOUT CONTRAST  TECHNIQUE: Contiguous axial images were obtained from the base of the skull through the vertex without intravenous contrast.  COMPARISON:  CT 10/18/2013, MRI 10/09/2013  FINDINGS: Multiple metastatic deposits to the brain are noted. These are best seen on the prior MRI. The largest lesion is in the right frontal parietal white matter measuring 17 mm. This is slightly hyperdense and unchanged from the prior study. There is a moderate amount of surrounding white matter edema. There is also a metastatic lesion in the high left frontal lobe with surrounding white matter edema. Smaller amounts of edema are present in the left occipital lobe and cerebellum bilaterally related to metastatic disease.  There is generalized atrophy. Chronic microvascular ischemic changes in the white matter. No acute infarct. No acute hemorrhage or midline shift.  IMPRESSION: Multiple metastatic deposits in the brain. There is white matter edema bilaterally which appears to have progressed in the frontal lobes bilaterally. No acute hemorrhage.   Electronically Signed   By: Franchot Gallo M.D.   On: 11/01/2013 07:17   Ct Head Wo Contrast  10/18/2013   CLINICAL DATA:   Dizziness, history of metastatic colorectal cancer with brain metastasis.  EXAM: CT HEAD WITHOUT CONTRAST  TECHNIQUE: Contiguous axial images were obtained from the base of the skull through the vertex without intravenous contrast.  COMPARISON:  CT of the head October 09, 2013. MRI of the brain October 09, 2013.  FINDINGS: Mild vasogenic edema and right cerebellum, right centrum semiovale and left greater than right bifrontal lobes associated with patient's mid metastatic disease better seen on prior MRI. 11 mm hyperdensity in the right centrum semiovale is relatively unchanged. No midline shift.  Ventricles and sulci are normal  for age. Similar patchy supratentorial white matter hypodensities, no intraparenchymal hemorrhage.  No abnormal extra-axial fluid collections. Mild calcific atherosclerosis of the carotid siphons. Remote left medial orbital blowout fracture. No skull fracture. Ocular globes and orbital contents are nonsuspicious.  IMPRESSION: Stable appearance of the head: Brain metastasis better seen on MRI of the brain March 10, 2014 ; no acute intracranial process.   Electronically Signed   By: Elon Alas   On: 10/18/2013 02:54   Ct Head (brain) Wo Contrast  10/09/2013   CLINICAL DATA:  Left arm and leg weakness.  EXAM: CT HEAD WITHOUT CONTRAST  TECHNIQUE: Contiguous axial images were obtained from the base of the skull through the vertex without contrast.  COMPARISON:  None  FINDINGS: There is a 1.0 x 1.1 cm hyperdense lesion in the right parietal white matter just above the right lateral ventricle. There is vasogenic edema around this hyperdense focus. Findings are consistent with a small intraparenchymal hemorrhage or lesion. Patient does have underlying white matter changes but there is also vasogenic edema in the left frontal lobe near the vertex and concern for a small hyperdense lesion in the left frontal-parietal vertex on image number 27. There is also concern for a small area of edema in  the left temporal lobe on image number 11.  There is no evidence for midline shift or significant hydrocephalus. There is deformity of the medial left orbital wall consistent with old injury. No acute bone abnormality.  IMPRESSION: There is a 1.1 cm hyperdense focus in the right parietal white matter with surrounding vasogenic edema. This could represent a hyperdense lesion or small hemorrhage. There is also concern for vasogenic edema and a hyperdense lesion near the left vertex and focal edema in the left temporal lobe. Findings raise concern for multiple lesions and metastatic disease. Recommend further characterization with a brain MRI (with and without contrast).  Critical Value/emergent results were called by telephone at the time of interpretation on 10/09/2013 at 6:13 PM to Dr. Joseph Berkshire , who verbally acknowledged these results.   Electronically Signed   By: Markus Daft M.D.   On: 10/09/2013 18:18   Mr Jeri Cos [id number] Contrast  10/09/2013   CLINICAL DATA:  Rectal cancer, gait imbalance, possible intracranial lesions.  EXAM: MRI HEAD WITHOUT AND WITH CONTRAST  TECHNIQUE: Multiplanar, multiecho pulse sequences of the brain and surrounding structures were obtained without and with intravenous contrast.  CONTRAST:  65mL MULTIHANCE GADOBENATE DIMEGLUMINE 529 MG/ML IV SOLN  COMPARISON:  CT of the head October 09, 2013 at 1751 hr  FINDINGS: At least 7 infratentorial and 5 supratentorial intraparenchymal enhancing metastasis seen. (dominant right inferior 15 x 12 mm lesion ; including 7 x 5 mm mesial left temporal, 7 x 6 mm left temporal, 12 x 11 mm right posterior frontal, 9 x 13 mm left posterior frontal lesions). Lesions show low T2 signal, and surrounding T2 bright vasogenic edema. Subcentimeter enhancing focus along the anterior inferior falx/ right anterior cranial fossa may reflect meningioma as there is hyperostosis on prior head CT. No midline shift. No reduced diffusion to suggest acute ischemia. No  suspicious intraparenchymal intrinsic T1 shortening to suggest blood products.  Moderate ventriculomegaly, likely on the basis of global parenchymal brain volume loss as there is commensurate enlargement of cerebral sulci and cerebellar folia. Patchy supratentorial white matter T2 hyperintensities, exclusive of the vasogenic edema suggests moderate chronic small vessel ischemic disease. No susceptibility artifact to suggest hemorrhage.  No abnormal extra-axial fluid collections, or  leptomeningeal enhancement. Major intracranial vascular flow voids observed at the skull base, with mild the local ectatic appearance which may reflect chronic hypertension.  Ocular globes and orbital contents are nonsuspicious though not tailored for evaluation. Remote left medial orbital blowout fracture. The paranasal sinuses and mastoid air cells appear well-aerated. At least mild temporomandibular osteoarthrosis. No abnormal sellar expansion. Craniocervical junction maintained. No definite abnormal calvarial bone marrow signal.  IMPRESSION: At least 7 infratentorial and 5 supratentorial enhancing lesions highly concerning for metastatic disease, the largest lesion within right inferior cerebellum measuring 15 x 12 mm. Associated vasogenic edema without midline shift. No intracranial hemorrhage or hemorrhagic lesions.  Subcentimeter right parafalcine/anterior cranial fossa extra-axial enhancing lesion likely reflects a meningioma.  Involutional changes. Moderate white matter changes suggest chronic small vessel ischemic disease.   Electronically Signed   By: Elon Alas   On: 10/09/2013 22:47   Dg Shoulder Left  11/01/2013   CLINICAL DATA:  Metastatic colon cancer.  Shoulder pain  EXAM: LEFT SHOULDER - 2+ VIEW  COMPARISON:  03/08/2013  FINDINGS: Large destructive mass of the scapula consistent with metastatic disease. There is significant widening of the acromial humeral distance which may be due to soft tissue tumor.  There  is ill-defined hypodensity in the left proximal humerus which could be due to tumor infiltration. There is also sclerosis of the humeral head which could be AVN or relative sparing of tumor.  IMPRESSION: Large destructive mass of the left scapula consistent with metastatic disease. Possible metastatic disease in the proximal humerus.   Electronically Signed   By: Franchot Gallo M.D.   On: 11/01/2013 07:57   Mr Jodene Nam Head/brain Wo Cm  10/10/2013   CLINICAL DATA:  Stroke. Left arm and leg weakness with grip strength weakness and change in gait for 2 days. Metastatic rectal cancer.  EXAM: MRA HEAD WITHOUT CONTRAST  TECHNIQUE: Angiographic images of the Circle of Willis were obtained using MRA technique without intravenous contrast.  COMPARISON:  None.  FINDINGS: Visualized distal vertebral arteries are patent. Left vertebral artery is dominant. There is mild irregularity and mild narrowing of the distal right vertebral artery. PICA origins are not clearly identified. Basilar artery is patent without evidence of significant stenosis. SCAs are patent. There is a fetal origin of the left PCA. A right posterior communicating artery is also present. There is mild irregularity of the PCAs bilaterally with a mild to moderate focal stenosis involving the midportion of the right P2 segment.  Internal carotid arteries are patent from skullbase to carotid terminus. ACA origins are patent. Left A1 segment is hypoplastic. Anterior communicating artery is patent and is the dominant supply for the more distal left ACA. MCA origins and visualized branches are patent without evidence of flow-limiting stenosis. No intracranial aneurysm is identified.  IMPRESSION: 1. No evidence of major intracranial arterial occlusion. 2. Mild to moderate focal stenosis involving the midportion of the right P2 segment with mild irregularity of the PCAs bilaterally, suggestive of atherosclerosis. 3. No evidence of flow limiting stenosis in the anterior  circulation.   Electronically Signed   By: Logan Bores   On: 10/10/2013 19:39    CBC  Recent Labs Lab 11/01/13 0600 11/01/13 1546  WBC 3.4* 3.3*  HGB 12.1 13.4  HCT 35.2* 38.1  PLT 124* 152  MCV 96.4 96.2  MCH 33.2 33.8  MCHC 34.4 35.2  RDW 14.3 14.2  LYMPHSABS 0.3*  --   MONOABS 0.3  --   EOSABS 0.1  --  BASOSABS 0.0  --     Chemistries   Recent Labs Lab 11/01/13 0600 11/01/13 1546  NA 142  --   K 3.5*  --   CL 105  --   CO2 22  --   GLUCOSE 112*  --   BUN 6  --   CREATININE 0.43* 0.48*  CALCIUM 8.3*  --   AST 17  --   ALT 13  --   ALKPHOS 55  --   BILITOT 0.7  --    ------------------------------------------------------------------------------------------------------------------ estimated creatinine clearance is 55 ml/min (by C-G formula based on Cr of 0.48). ------------------------------------------------------------------------------------------------------------------ No results found for this basename: HGBA1C,  in the last 72 hours ------------------------------------------------------------------------------------------------------------------ No results found for this basename: CHOL, HDL, LDLCALC, TRIG, CHOLHDL, LDLDIRECT,  in the last 72 hours ------------------------------------------------------------------------------------------------------------------ No results found for this basename: TSH, T4TOTAL, FREET3, T3FREE, THYROIDAB,  in the last 72 hours ------------------------------------------------------------------------------------------------------------------ No results found for this basename: VITAMINB12, FOLATE, FERRITIN, TIBC, IRON, RETICCTPCT,  in the last 72 hours  Coagulation profile No results found for this basename: INR, PROTIME,  in the last 168 hours  No results found for this basename: DDIMER,  in the last 72 hours  Cardiac Enzymes No results found for this basename: CK, CKMB, TROPONINI, MYOGLOBIN,  in the last 168  hours ------------------------------------------------------------------------------------------------------------------ No components found with this basename: POCBNP,     Osaze Hubbert D.O. on 11/02/2013 at 8:23 AM  Between 7am to 7pm - Pager - [phone]  After 7pm go to [email or web address] - password [email or web address]  And look for the night coverage person covering for me after hours  [facility] Office  [phone]

## 2013-11-02 NOTE — Evaluation (Signed)
Physical Therapy Evaluation Patient Details Name: Katie [name] MRN: [id number] DOB: 12-20-1933 Today's Date: 11/02/2013 Time: 0932-6712 PT Time Calculation (min): 37 min  PT Assessment / Plan / Recommendation History of Present Illness    Katie [name] is a 80 y.o. female  With a history of hypertension, GERD, metastatic disease that presents to the emergency department for c weakness. Patient is known to have metastatic disease to her brain as well as scapula which is seen on CT as well as x-ray.    Clinical Impression  80 yo F with metastatic cancer. Presents to PT with decreased functional use of left side. Pt states she would like to go home with assist of family. Recommend hemi walker, WC, and sling for left arm.  Pt very cooperative and appreciative of assistance in deciding equipment for home. Spent much of session discussing DC needs and home safety.      PT Assessment  Patient needs continued PT services    Follow Up Recommendations  Home health PT (if eligible to receive)    Does the patient have the potential to tolerate intense rehabilitation      Barriers to Discharge        Equipment Recommendations  Other (comment);Wheelchair (measurements PT);Wheelchair cushion (measurements PT) (hemi walker, consider sling for left arm)    Recommendations for Other Services     Frequency Min 3X/week    Precautions / Restrictions Precautions Precautions: Fall Restrictions Weight Bearing Restrictions: No   Pertinent Vitals/Pain Pt reports pain in left shoulder with movement but did not rate.  Had pain meds about 1 hour prior to PT sesison per pt.        Mobility  Bed Mobility Overal bed mobility: Needs Assistance Bed Mobility: Supine to Sit Supine to sit: Min assist General bed mobility comments: assist with LLE as pt did not realize LLE not on floor and still in bed Transfers Overall transfer level: Needs assistance Transfers: Sit to/from Stand Sit to Stand: Min  guard General transfer comment: cues for safest technique. Practiced several times. Ambulation/Gait Ambulation/Gait assistance: Min assist Ambulation Distance (Feet): 3 Feet-forward and backwards Assistive device: Hemi-walker (used RUE for hemi walker) Gait Pattern/deviations: Step-to pattern;Narrow base of support;Decreased dorsiflexion - left Gait velocity interpretation: <1.8 ft/sec, indicative of risk for recurrent falls General Gait Details: difficulty with placement of LLE, especially when stepping backwards    Exercises     PT Diagnosis: Difficulty walking  PT Problem List: Decreased strength;Decreased activity tolerance;Decreased balance;Decreased mobility;Decreased coordination;Decreased knowledge of use of DME PT Treatment Interventions: DME instruction;Gait training;Stair training;Functional mobility training;Therapeutic activities;Therapeutic exercise;Balance training;Neuromuscular re-education;Patient/family education     PT Goals(Current goals can be found in the care plan section) Acute Rehab PT Goals Patient Stated Goal: to return home  PT Goal Formulation: With patient Time For Goal Achievement: 11/10/13 Potential to Achieve Goals: Good  Visit Information  Last PT Received On: 11/02/13 Assistance Needed: +1 Reason Eval/Treat Not Completed: Pain limiting ability to participate (Pt declined PT now secondary to just getting back to bed)       Prior [place] expects to be discharged to:: Private residence Living Arrangements: Children Available Help at Discharge: Family;Available 24 hours/day Type of Home: House Home Access: Stairs to enter [employer] of Steps: 1 Entrance Stairs-Rails: None Home Layout: One level Home Equipment: Walker - 2 wheels;Bedside commode;Hospital bed (pt reports hospital bed being returned today d/t broken ) Prior Function Level of Independence: Independent with assistive  device(s)  Communication Communication: No difficulties Dominant Hand: Right    Cognition  Cognition Arousal/Alertness: Awake/alert Behavior During Therapy: WFL for tasks assessed/performed Overall Cognitive Status: Within Functional Limits for tasks assessed    Extremity/Trunk Assessment Upper Extremity Assessment Upper Extremity Assessment: LUE deficits/detail LUE Deficits / Details: significantly decreased functional use of LLE Lower Extremity Assessment Lower Extremity Assessment: LLE deficits/detail LLE Deficits / Details: Grossly 4-/5 throughout LLE LLE Coordination: decreased gross motor   Balance Balance Overall balance assessment: Needs assistance Sitting-balance support: Feet supported;No upper extremity supported Sitting balance-Leahy Scale: Good Standing balance support: No upper extremity supported Standing balance-Leahy Scale: Fair General Comments General comments (skin integrity, edema, etc.): Pt incontinent of urine when sitting EOB.    End of Session PT - End of Session Equipment Utilized During Treatment: Gait belt Activity Tolerance: Patient tolerated treatment well Patient left: in chair;with call bell/phone within reach;with nursing/sitter in room Nurse Communication: Mobility status  GP     Melvern Banker 11/02/2013, 12:45 PM Lavonia Dana, Dante 11/02/2013

## 2013-11-03 MED ORDER — HEPARIN SOD (PORK) LOCK FLUSH 100 UNIT/ML IV SOLN
500.0000 [IU] | INTRAVENOUS | Status: AC | PRN
  Administered 2013-11-03: 500 [IU]

## 2013-11-03 NOTE — Progress Notes (Signed)
Spoke with patient in the room this morning about DME needs and choice of hospice provider.  Pt says she has glaucoma so I read her the choice list of hospice providers in [place].  [facility].  Stated that she had all the DME she needed at home except a wheelchair so that has been communicated to attending MD and ordered.  Contacted [facility] and gave them the referral.  Pt planning for d/c around noon and wishes for her wheelchair to deliver to her home. Confirmed address and phone number for home as listed in EPIC.

## 2013-11-03 NOTE — Progress Notes (Signed)
Discharge instructions reviewed with pt.  Pt verbalized understanding and had no questions.  Pt discharged in stable condition via wheelchair with brother.  [facility] notified of pt's discharge.  Katie [name]

## 2013-11-03 NOTE — Discharge Summary (Signed)
Physician Discharge Summary  Katie [name] [id number] DOB: 1933/10/22 DOA: 11/01/2013  PCP: Elizabeth Palau, MD  Admit date: 11/01/2013 Discharge date: 11/03/2013  Time spent: 35 minutes  Recommendations for Outpatient Follow-up:  Patient will be discharged home with hospice care. She is to follow up with her primary care physician within one week of discharge. She should continue taking her medications as prescribed.   Discharge Diagnoses:  Active Problems:   Metastatic cancer to brain   Palliative care encounter   Neoplasm related pain (acute) (chronic)   Discharge Condition: Stable  Diet recommendation: heart healthy  Filed Weights   11/01/13 0527 11/01/13 1411  Weight: 74.844 kg (165 lb) 72.9 kg (160 lb 11.5 oz)    History of present illness:  Katie [name] is a 80 y.o. female  With a history of hypertension, GERD, metastatic disease that presents to the emergency department for c weakness. Patient is known to have metastatic disease to her brain as well as scapula which is seen on CT as well as x-ray. Patient states that she has been having left shoulder pain for approximately 2 days has had difficulty using her left arm due to the pain. She states the pain has been a 7-8 at 10 and is made worse with movement. She denies any trauma or any recent fall. Of note patient was recently admitted to [facility] and transferred to [facility] January 2015. She was told that she would need radiation however per the patient she decided not to have radiation therapy.   Hospital Course:  Left Shoulder Pain and weakness due to metastasis  -Continue pain control and decadron 4mg  q6h  -palliative consulted and patient will be discharged with hospice -PT consulted   Metastatic disease  -Possibly GI/Rectal malignancy as primary source.  -Patient was admitted and transferred to [place] at the beginning of January. She was supposedly told by physicians at [facility] that  she needed radiation for her brain metastasis, however she refused.  -palliative care consulted, patient will go home with hospice  Leukopenia and thrombocytopenia, chronic  Continue to monitor   Hypokalemia  Replaced, will need to be monitored.  Solitary pulmonary nodule   Hypertension  Continue verapamil   Procedures:  None  Consultations:  Palliative Care  Discharge Exam: Filed Vitals:   11/03/13 0500  BP: 135/84  Pulse: 62  Temp: 97.8 F (36.6 C)  Resp: 20   Exam  General: Well developed, well nourished, NAD, appears stated age  HEENT: NCAT, mucous membranes moist. Neck: Supple, no JVD, no masses  Cardiovascular: S1 S2 auscultated,  Regular rate and rhythm.  Respiratory: Clear to auscultation bilaterally with equal chest rise  Abdomen: Soft, nontender, nondistended, + bowel sounds  Extremities: warm dry without cyanosis clubbing or edema  Neuro: AAOx3, cranial nerves grossly intact.  Skin: Without rashes exudates or nodules  Psych: Normal affect and demeanor with intact judgement and insight  Discharge Instructions  Discharge Orders   Future Orders Complete By Expires   Diet - low sodium heart healthy  As directed    Discharge instructions  As directed    Comments:     Patient will be discharged home with hospice care. She is to follow up with her primary care physician within one week of discharge. She should continue taking her medications as prescribed.   Increase activity slowly  As directed        Medication List         Acetaminophen 500  MG coapsule  Take 1 capsule by mouth every 6 (six) hours as needed for fever or pain.     ALPRAZolam 0.5 MG tablet  Commonly known as:  XANAX  Take 1 tablet (0.5 mg total) by mouth at bedtime as needed for sleep.     benazepril-hydrochlorthiazide 20-25 MG per tablet  Commonly known as:  LOTENSIN HCT  Take 1 tablet by mouth 2 (two) times daily.     dexamethasone 4 MG tablet  Commonly known as:  DECADRON    Take 4 mg by mouth every 6 (six) hours.     ferrous sulfate 325 (65 FE) MG tablet  Take 325 mg by mouth 2 (two) times daily.     latanoprost 0.005 % ophthalmic solution  Commonly known as:  XALATAN  Place 1 drop into both eyes at bedtime.     oxyCODONE 5 MG immediate release tablet  Commonly known as:  Oxy IR/ROXICODONE  Take 1 tablet by mouth every 4 (four) hours as needed for moderate pain or severe pain.     pantoprazole 40 MG tablet  Commonly known as:  PROTONIX  Take 40 mg by mouth daily.     senna-docusate 8.6-50 MG per tablet  Commonly known as:  Senokot-S  Take 1 tablet by mouth daily as needed.     verapamil 120 MG CR tablet  Commonly known as:  CALAN-SR  Take 120 mg by mouth 2 (two) times daily.       No Known Allergies     Follow-up Information   Follow up with Elizabeth Palau, MD. Schedule an appointment as soon as possible for a visit in 1 week.   Specialty:  Family Medicine   Contact information:   [address] [place] [address] [phone]        The results of significant diagnostics from this hospitalization (including imaging, microbiology, ancillary and laboratory) are listed below for reference.    Significant Diagnostic Studies: Dg Chest 2 View  10/10/2013   CLINICAL DATA:  Stroke symptoms, known metastatic malignancy  EXAM: CHEST  2 VIEW  COMPARISON:  CT scan of the chest dated Mar 01, 2013.  FINDINGS: The lungs are adequately inflated. Innumerable pulmonary nodules measuring less than 1 cm in size are demonstrated bilaterally. There is no alveolar infiltrate. The cardiac silhouette is normal in size. There is tortuosity of the descending thoracic aorta. A Port-A-Cath appliance is in place with its tip in the region of the distal SVC. There is no pleural effusion or pneumothorax. The observed portions of the bony thorax exhibit destructive change of the left scapula.  IMPRESSION: 1. Widespread pulmonary metastatic nodules  are present. 2. There is no evidence of pneumonia nor CHF nor atelectasis. 3. Destructive change of the scapula is nearly complete.   Electronically Signed   By: David  Martinique   On: 10/10/2013 18:23   Ct Head Wo Contrast  11/01/2013   CLINICAL DATA:  Metastatic colon cancer.  Extremity weakness  EXAM: CT HEAD WITHOUT CONTRAST  TECHNIQUE: Contiguous axial images were obtained from the base of the skull through the vertex without intravenous contrast.  COMPARISON:  CT 10/18/2013, MRI 10/09/2013  FINDINGS: Multiple metastatic deposits to the brain are noted. These are best seen on the prior MRI. The largest lesion is in the right frontal parietal white matter measuring 17 mm. This is slightly hyperdense and unchanged from the prior study. There is a moderate amount of surrounding white matter edema. There  is also a metastatic lesion in the high left frontal lobe with surrounding white matter edema. Smaller amounts of edema are present in the left occipital lobe and cerebellum bilaterally related to metastatic disease.  There is generalized atrophy. Chronic microvascular ischemic changes in the white matter. No acute infarct. No acute hemorrhage or midline shift.  IMPRESSION: Multiple metastatic deposits in the brain. There is white matter edema bilaterally which appears to have progressed in the frontal lobes bilaterally. No acute hemorrhage.   Electronically Signed   By: Franchot Gallo M.D.   On: 11/01/2013 07:17   Ct Head Wo Contrast  10/18/2013   CLINICAL DATA:  Dizziness, history of metastatic colorectal cancer with brain metastasis.  EXAM: CT HEAD WITHOUT CONTRAST  TECHNIQUE: Contiguous axial images were obtained from the base of the skull through the vertex without intravenous contrast.  COMPARISON:  CT of the head October 09, 2013. MRI of the brain October 09, 2013.  FINDINGS: Mild vasogenic edema and right cerebellum, right centrum semiovale and left greater than right bifrontal lobes associated with  patient's mid metastatic disease better seen on prior MRI. 11 mm hyperdensity in the right centrum semiovale is relatively unchanged. No midline shift.  Ventricles and sulci are normal for age. Similar patchy supratentorial white matter hypodensities, no intraparenchymal hemorrhage.  No abnormal extra-axial fluid collections. Mild calcific atherosclerosis of the carotid siphons. Remote left medial orbital blowout fracture. No skull fracture. Ocular globes and orbital contents are nonsuspicious.  IMPRESSION: Stable appearance of the head: Brain metastasis better seen on MRI of the brain March 10, 2014 ; no acute intracranial process.   Electronically Signed   By: Elon Alas   On: 10/18/2013 02:54   Ct Head (brain) Wo Contrast  10/09/2013   CLINICAL DATA:  Left arm and leg weakness.  EXAM: CT HEAD WITHOUT CONTRAST  TECHNIQUE: Contiguous axial images were obtained from the base of the skull through the vertex without contrast.  COMPARISON:  None  FINDINGS: There is a 1.0 x 1.1 cm hyperdense lesion in the right parietal white matter just above the right lateral ventricle. There is vasogenic edema around this hyperdense focus. Findings are consistent with a small intraparenchymal hemorrhage or lesion. Patient does have underlying white matter changes but there is also vasogenic edema in the left frontal lobe near the vertex and concern for a small hyperdense lesion in the left frontal-parietal vertex on image number 27. There is also concern for a small area of edema in the left temporal lobe on image number 11.  There is no evidence for midline shift or significant hydrocephalus. There is deformity of the medial left orbital wall consistent with old injury. No acute bone abnormality.  IMPRESSION: There is a 1.1 cm hyperdense focus in the right parietal white matter with surrounding vasogenic edema. This could represent a hyperdense lesion or small hemorrhage. There is also concern for vasogenic edema and a  hyperdense lesion near the left vertex and focal edema in the left temporal lobe. Findings raise concern for multiple lesions and metastatic disease. Recommend further characterization with a brain MRI (with and without contrast).  Critical Value/emergent results were called by telephone at the time of interpretation on 10/09/2013 at 6:13 PM to Dr. Joseph Berkshire , who verbally acknowledged these results.   Electronically Signed   By: Markus Daft M.D.   On: 10/09/2013 18:18   Mr Jeri Cos KZ Contrast  10/09/2013   CLINICAL DATA:  Rectal cancer, gait imbalance, possible intracranial  lesions.  EXAM: MRI HEAD WITHOUT AND WITH CONTRAST  TECHNIQUE: Multiplanar, multiecho pulse sequences of the brain and surrounding structures were obtained without and with intravenous contrast.  CONTRAST:  75mL MULTIHANCE GADOBENATE DIMEGLUMINE 529 MG/ML IV SOLN  COMPARISON:  CT of the head October 09, 2013 at 1751 hr  FINDINGS: At least 7 infratentorial and 5 supratentorial intraparenchymal enhancing metastasis seen. (dominant right inferior 15 x 12 mm lesion ; including 7 x 5 mm mesial left temporal, 7 x 6 mm left temporal, 12 x 11 mm right posterior frontal, 9 x 13 mm left posterior frontal lesions). Lesions show low T2 signal, and surrounding T2 bright vasogenic edema. Subcentimeter enhancing focus along the anterior inferior falx/ right anterior cranial fossa may reflect meningioma as there is hyperostosis on prior head CT. No midline shift. No reduced diffusion to suggest acute ischemia. No suspicious intraparenchymal intrinsic T1 shortening to suggest blood products.  Moderate ventriculomegaly, likely on the basis of global parenchymal brain volume loss as there is commensurate enlargement of cerebral sulci and cerebellar folia. Patchy supratentorial white matter T2 hyperintensities, exclusive of the vasogenic edema suggests moderate chronic small vessel ischemic disease. No susceptibility artifact to suggest hemorrhage.  No  abnormal extra-axial fluid collections, or leptomeningeal enhancement. Major intracranial vascular flow voids observed at the skull base, with mild the local ectatic appearance which may reflect chronic hypertension.  Ocular globes and orbital contents are nonsuspicious though not tailored for evaluation. Remote left medial orbital blowout fracture. The paranasal sinuses and mastoid air cells appear well-aerated. At least mild temporomandibular osteoarthrosis. No abnormal sellar expansion. Craniocervical junction maintained. No definite abnormal calvarial bone marrow signal.  IMPRESSION: At least 7 infratentorial and 5 supratentorial enhancing lesions highly concerning for metastatic disease, the largest lesion within right inferior cerebellum measuring 15 x 12 mm. Associated vasogenic edema without midline shift. No intracranial hemorrhage or hemorrhagic lesions.  Subcentimeter right parafalcine/anterior cranial fossa extra-axial enhancing lesion likely reflects a meningioma.  Involutional changes. Moderate white matter changes suggest chronic small vessel ischemic disease.   Electronically Signed   By: Elon Alas   On: 10/09/2013 22:47   Dg Shoulder Left  11/01/2013   CLINICAL DATA:  Metastatic colon cancer.  Shoulder pain  EXAM: LEFT SHOULDER - 2+ VIEW  COMPARISON:  03/08/2013  FINDINGS: Large destructive mass of the scapula consistent with metastatic disease. There is significant widening of the acromial humeral distance which may be due to soft tissue tumor.  There is ill-defined hypodensity in the left proximal humerus which could be due to tumor infiltration. There is also sclerosis of the humeral head which could be AVN or relative sparing of tumor.  IMPRESSION: Large destructive mass of the left scapula consistent with metastatic disease. Possible metastatic disease in the proximal humerus.   Electronically Signed   By: Franchot Gallo M.D.   On: 11/01/2013 07:57   Mr Jodene Nam Head/brain Wo  Cm  10/10/2013   CLINICAL DATA:  Stroke. Left arm and leg weakness with grip strength weakness and change in gait for 2 days. Metastatic rectal cancer.  EXAM: MRA HEAD WITHOUT CONTRAST  TECHNIQUE: Angiographic images of the Circle of Willis were obtained using MRA technique without intravenous contrast.  COMPARISON:  None.  FINDINGS: Visualized distal vertebral arteries are patent. Left vertebral artery is dominant. There is mild irregularity and mild narrowing of the distal right vertebral artery. PICA origins are not clearly identified. Basilar artery is patent without evidence of significant stenosis. SCAs are patent. There is  a fetal origin of the left PCA. A right posterior communicating artery is also present. There is mild irregularity of the PCAs bilaterally with a mild to moderate focal stenosis involving the midportion of the right P2 segment.  Internal carotid arteries are patent from skullbase to carotid terminus. ACA origins are patent. Left A1 segment is hypoplastic. Anterior communicating artery is patent and is the dominant supply for the more distal left ACA. MCA origins and visualized branches are patent without evidence of flow-limiting stenosis. No intracranial aneurysm is identified.  IMPRESSION: 1. No evidence of major intracranial arterial occlusion. 2. Mild to moderate focal stenosis involving the midportion of the right P2 segment with mild irregularity of the PCAs bilaterally, suggestive of atherosclerosis. 3. No evidence of flow limiting stenosis in the anterior circulation.   Electronically Signed   By: Logan Bores   On: 10/10/2013 19:39    Microbiology: No results found for this or any previous visit (from the past 240 hour(s)).   Labs: Basic Metabolic Panel:  Recent Labs Lab 11/01/13 0600 11/01/13 1546 11/02/13 0900  NA 142  --  140  K 3.5*  --  3.6*  CL 105  --  103  CO2 22  --  23  GLUCOSE 112*  --  194*  BUN 6  --  9  CREATININE 0.43* 0.48* 0.45*  CALCIUM 8.3*   --  9.1   Liver Function Tests:  Recent Labs Lab 11/01/13 0600  AST 17  ALT 13  ALKPHOS 55  BILITOT 0.7  PROT 5.9*  ALBUMIN 2.9*   No results found for this basename: LIPASE, AMYLASE,  in the last 168 hours No results found for this basename: AMMONIA,  in the last 168 hours CBC:  Recent Labs Lab 11/01/13 0600 11/01/13 1546  WBC 3.4* 3.3*  NEUTROABS 2.7  --   HGB 12.1 13.4  HCT 35.2* 38.1  MCV 96.4 96.2  PLT 124* 152   Cardiac Enzymes: No results found for this basename: CKTOTAL, CKMB, CKMBINDEX, TROPONINI,  in the last 168 hours BNP: BNP (last 3 results) No results found for this basename: PROBNP,  in the last 8760 hours CBG: No results found for this basename: GLUCAP,  in the last 168 hours     Signed:  Cristal Ford  [facility] 11/03/2013, 9:33 AM

## 2013-11-03 NOTE — Progress Notes (Signed)
Notified patient request services of [facility] after discharge.  Spoke with pt at bedside to initiate education related to hospice services, philosophy and team approach to care; she voiced good understanding of information. Per discussion/notes plan is to d/c later today by person vehicle; pt stated she will 'call her brother to come get her once the nurse lets her know she can' [facility] Admission RN will follow up once pt is home  DME -per pt she has a walker and hospital bed through [facility] currently in the home; and may want the hospital bed switched out to a fully electric bed - she wants to discuss this with [facility] team once home; she requests a light weight wheel chair be delivered to the home later today  Initial paperwork faxed to [facility]  Please notify [facility] when patient is ready to leave unit at d/c call [phone] (or [phone] if after 5 pm); [facility] information and contact numbers also given to pt during visit.   Please call with any questions or concerns   Danton Sewer, RN 11/03/2013, 12:53 PM [facility] RN Liaison [phone]

## 2013-11-03 NOTE — Discharge Instructions (Signed)
Metastatic Cancer, Questions and Answers KEY POINTS  Cancer happens when cells become abnormal and grow without control.  Where the cancer started is called the primary cancer or the primary tumor.  Metastatic cancer happens when cancer cells spread from the place where it started to other parts of the body.  When cancer spreads, the metastatic cancer keeps the same type of cells and the same name as the primary tumor.  The most common sites of metastasis are the lungs, bones, liver, and brain.  Treatment for metastatic cancer usually depends on the type of cancer. It also depends on the size and location of the metastasis. WHAT IS CANCER?   Cancer is a group of many related diseases. All cancers begin in cells. Cells are the building blocks that make up tissues. Cancer that arises from organs and solid tissues is called a solid tumor. Cancer that begins in blood cells is called leukemia, multiple myeloma, or lymphoma.  Normally, cells grow and divide to form new cells as the body needs them. When cells grow old and die, new cells take their place. Sometimes this orderly process goes wrong. New cells form when the body does not need them. Old cells do not die when they should.  The extra cells form a mass of tissue. This is called a growth or tumor. Tumors can be either not cancerous (benign) or cancerous (malignant). Benign tumors do not spread to other parts of the body. They are rarely a threat to life. Malignant tumors can spread (metastasize) and may be life threatening. WHAT IS PRIMARY CANCER?  Cancer can begin in any organ or tissue of the body. The original tumor is called the primary cancer or primary tumor. It is usually named for the part of the body or the type of cell in which it begins. WHAT IS METASTASIS, AND HOW DOES IT HAPPEN?   Metastasis means the spread of cancer. Cancer cells can break away from a primary tumor and enter the bloodstream or lymphatic system. This is the  system that produces, stores, and carries the cells that fight infections. That is how cancer cells spread to other parts of the body.  When cancer cells spread and form a new tumor in a different organ, the new tumor is a metastatic tumor. The cells in the metastatic tumor come from the original tumor. For example, if breast cancer spreads to the lungs, the metastatic tumor in the lung is made up of cancerous breast cells. It is not made of lung cells. In this case, the disease in the lungs is metastatic breast cancer (not lung cancer). Under a microscope, metastatic breast cancer cells generally look the same as the cancer cells in the breast. [place]?   Cancer cells can spread to almost any part of the body. Cancer cells frequently spread to lymph nodes (rounded masses of lymphatic tissue) near the primary tumor (regional lymph nodes). This is called lymph node involvement or regional disease. Cancer that spreads to other organs or to lymph nodes far from the primary tumor is called metastatic disease. Caregivers sometimes also call this distant disease.  The most common sites of metastasis from solid tumors are the lungs, bones, liver, and brain. Some cancers tend to spread to certain parts of the body. For example, lung cancer often metastasizes to the brain or bones. Colon cancer often spreads to the liver. Prostate cancer tends to spread to the bones. Breast cancer commonly spreads to the bones, lungs, liver,  or brain. But each of these cancers can spread to other parts of the body as well.  Because blood cells travel throughout the body, leukemia, multiple myeloma, and lymphoma cells are usually not localized when the cancer is diagnosed. Tumor cells may be found in the blood, several lymph nodes, or other parts of the body such as the liver or bones. This type of spread is not referred to as metastasis. ARE THERE SYMPTOMS OF METASTATIC CANCER?   Some people with metastatic  cancer do not have symptoms. Their metastases are found by X-rays and other tests performed for other reasons.  When symptoms of metastatic cancer occur, the type and frequency of the symptoms will depend on the size and location of the metastasis. For example, cancer that spreads to the bones is likely to cause pain and can lead to bone fractures. Cancer that spreads to the brain can cause a variety of symptoms. These include headaches, seizures, and unsteadiness. Shortness of breath may be a sign of lung involvement. Abdominal swelling or yellowing of the skin (jaundice) can indicate that cancer has spread to the liver.  Sometimes a person's primary cancer is discovered only after the metastatic tumor causes symptoms. For example, a man whose prostate cancer has spread to the bones in his pelvis may have lower back pain (caused by the cancer in his bones) before he experiences any symptoms from the primary tumor in his prostate. HOW DOES THE CAREGIVER KNOW WHETHER A CANCER IS PRIMARY OR A METASTATIC TUMOR?  To determine whether a tumor is primary or metastatic, the tumor will be examined under a microscope. In general, cancer cells look like abnormal versions of cells in the tissue where the cancer began. Using specialized diagnostic tests, a trained person is often able to tell where the cancer cells came from. Markers or antigens found in or on the cancer cells can indicate the primary site of the cancer.  Metastatic cancers may be found before or at the same time as the primary tumor, or months or years later. When a new tumor is found in a patient who has been treated for cancer in the past, it is more often a metastasis than another primary tumor. IS IT POSSIBLE TO HAVE A METASTATIC TUMOR WITHOUT HAVING A PRIMARY CANCER?  No. A metastatic tumor always starts from cancer cells in another part of the body. In most cases, when a metastatic tumor is found first, the primary tumor can be found. The  search for the primary tumor may involve lab tests, X-rays, and other procedures. However, in a small number of cases, a metastatic tumor is diagnosed but the primary tumor cannot be found, in spite of extensive tests. The tumor is metastatic because the cells are not like those in the organ or tissue in which the tumor is found. The primary tumor is called unknown or hidden (occult). The patient is said to have cancer of unknown primary origin (CUP). Because diagnostic techniques are constantly improving, the number of cases of CUP is going down.  WHAT TREATMENTS ARE USED FOR METASTATIC CANCER?   When cancer has metastasized, it may be treated with:  Chemotherapy.  Radiation therapy.  Biological therapy.  Hormone therapy.  Surgery.  Cryosurgery.  A combination of these.  The choice of treatment generally depends on the:  Type of primary cancer.  Size and location of the metastasis.  Patient's age and general health.  Types of treatments the patient has had in the past. In   patients with CUP, it is possible to treat the disease even though the primary tumor has not been located. The goal of treatment may be to control the cancer, or to relieve symptoms or side effects of treatment. ARE NEW TREATMENTS FOR METASTATIC CANCER BEING DEVELOPED?  Yes, many new cancer treatments are under study. To develop new treatments, the NCI sponsors clinical trials (research studies) with cancer patients in many hospitals, universities, medical schools, and cancer centers around the country. Clinical trials are a critical step in the improvement of treatment. Before any new treatment can be recommended for general use, doctors conduct studies to find out whether the treatment is both safe for patients and effective against the disease. The results of such studies have led to progress not only in the treatment of cancer, but in the detection, diagnosis, and prevention of the disease as well. Patients  interested in taking part in a clinical trial should talk with their caregivers. FOR MORE INFORMATION National Cancer Institute (NCI): www.cancer.gov Document Released: 01/26/2005 Document Revised: 12/14/2011 Document Reviewed: 09/13/2008 [employer] Patient Information 2014 [employer].  

## 2013-11-09 NOTE — Consult Note (Signed)
I have reviewed and discussed the care of this patient in detail with the nurse practitioner including pertinent patient records, physical exam findings and data. I agree with details of this encounter.  

## 2014-01-03 DEATH — deceased

## 2014-04-06 IMAGING — CT CT CHEST W/ CM
1 of 2 series · 14 of 29 positions shown, 18 images · IV contrast (OMNIPAQUE)
Comparison: 01/10/2013, 08/29/2010 and 12/22/2007.

CLINICAL DATA: Newly-diagnosed rectal carcinoma with recent
radiation therapy.  Follow-up lung nodules.

CT CHEST WITH CONTRAST
TECHNIQUE: Multidetector CT imaging of the chest was performed
following the standard protocol during bolus administration of
intravenous contrast.
Contrast: 80mL OMNIPAQUE IOHEXOL 300 MG/ML  SOLN

[Series 2: rtn chest with st · axial · 0.74mm/px · z∈[+898,+1158]mm · 14 of 62 slices shown, 18 images]
[im 5/62  mediastinal]
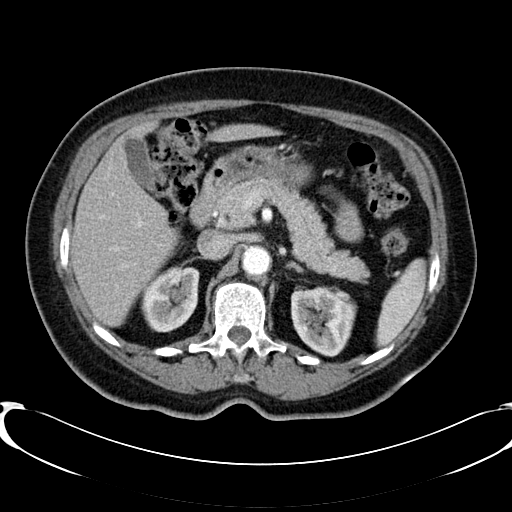
[im 5/62  lung]
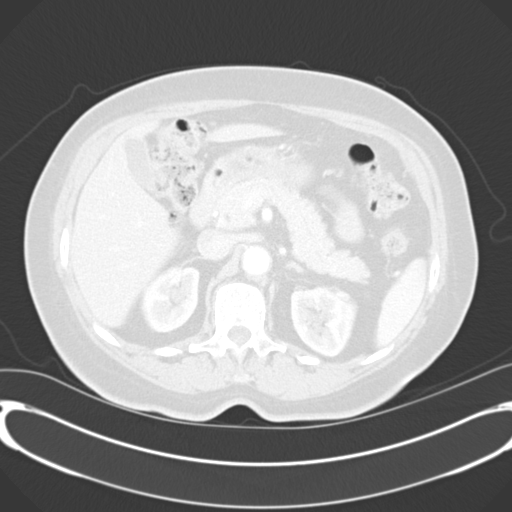
[im 9/62  lung]
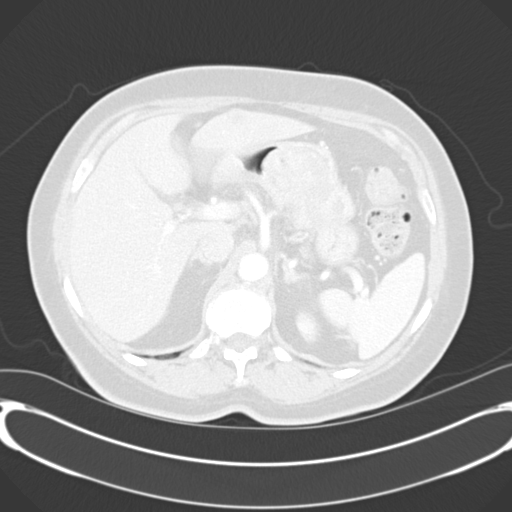
[im 14/62  lung]
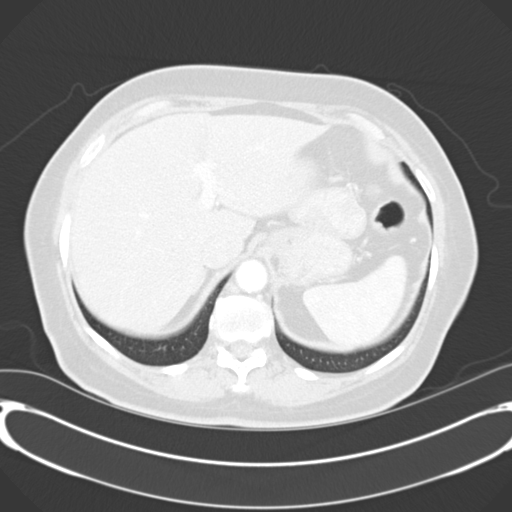
[im 18/62  lung]
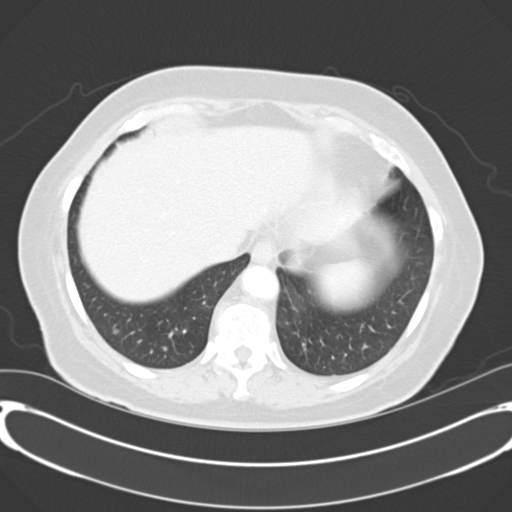
[im 22/62  mediastinal]
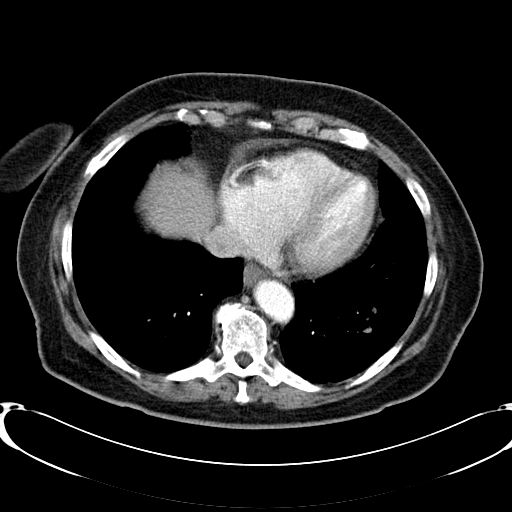
[im 22/62  lung]
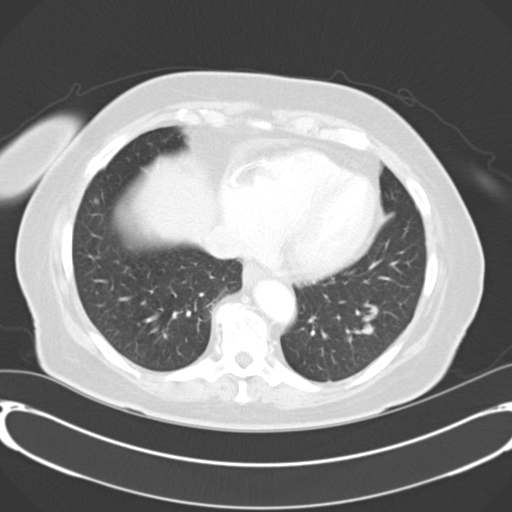
[im 27/62  lung]
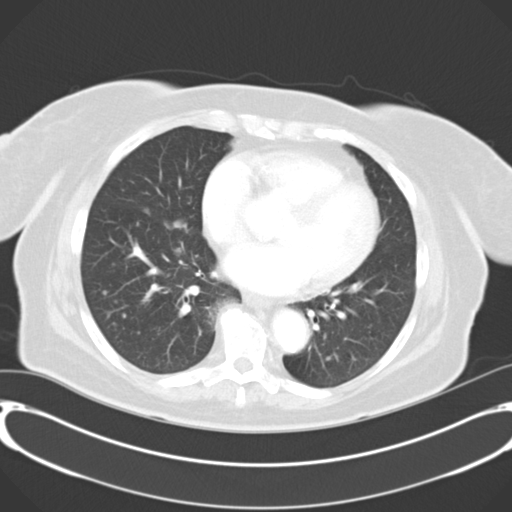
[im 30/62  lung]
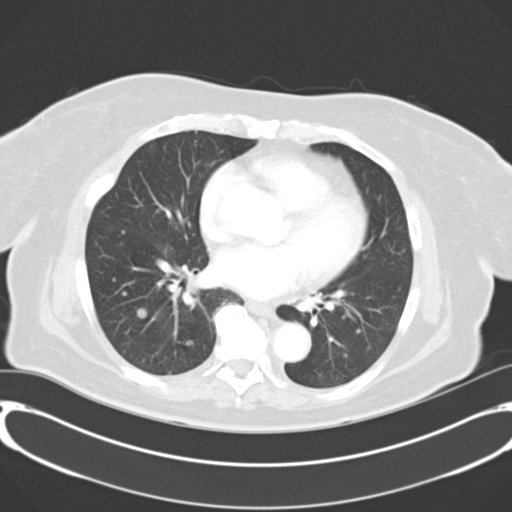
[im 31/62  lung]
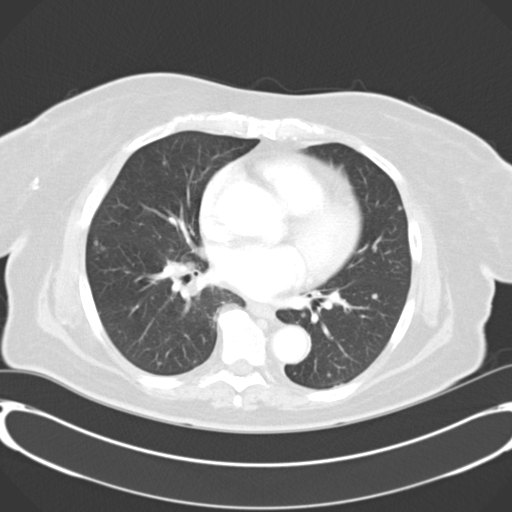
[im 35/62  mediastinal]
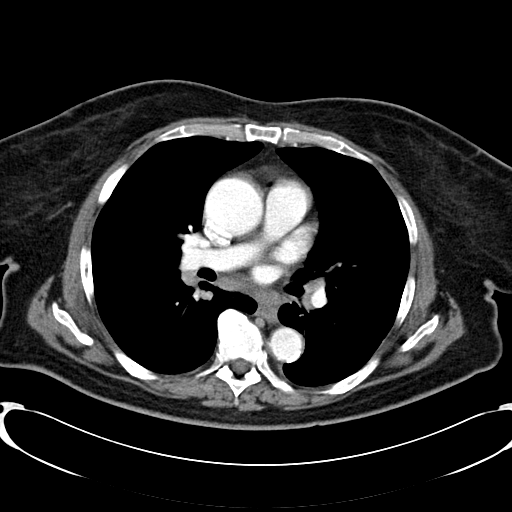
[im 35/62  lung]
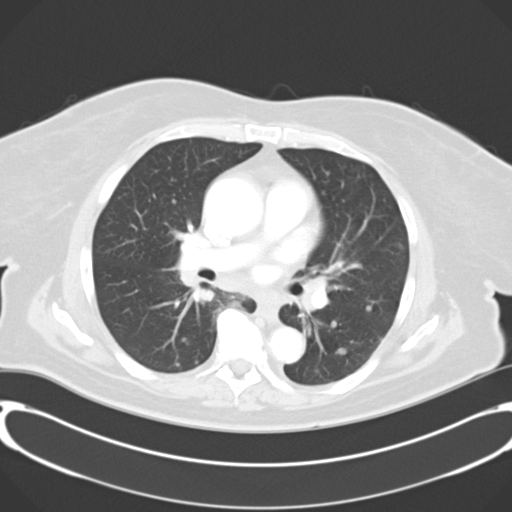
[im 40/62  lung]
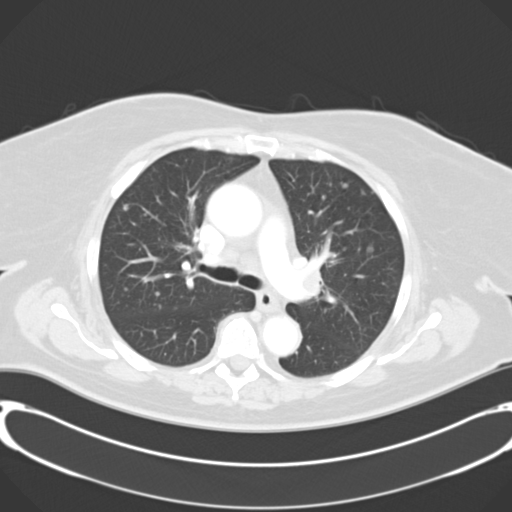
[im 44/62  lung]
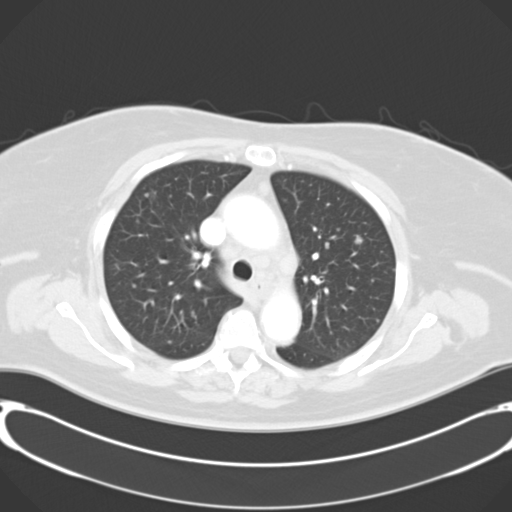
[im 48/62  lung]
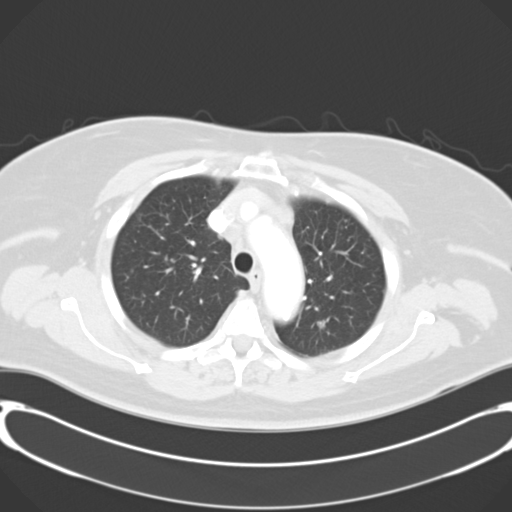
[im 53/62  mediastinal]
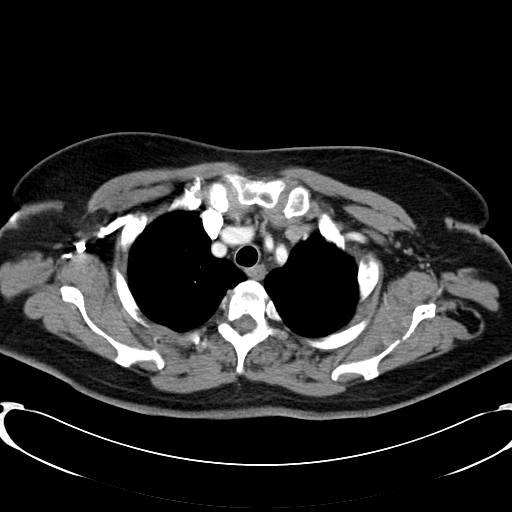
[im 53/62  lung]
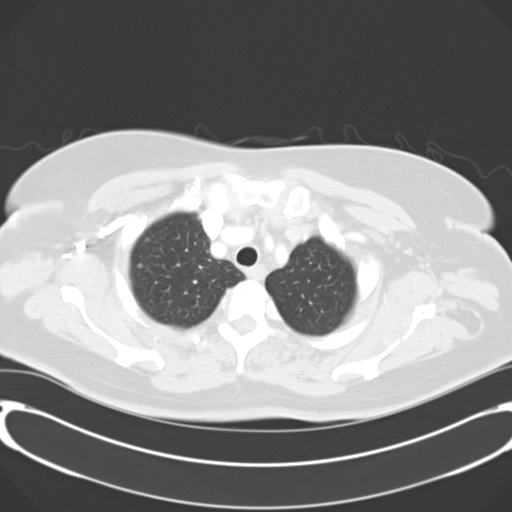
[im 57/62  lung]
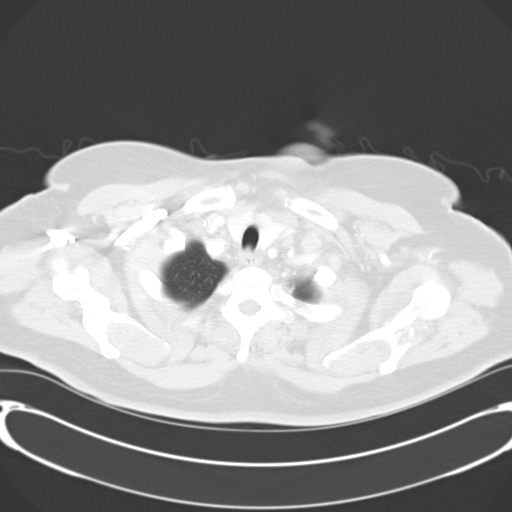

[14 of 29 positions shown; findings below may reference images not displayed]

FINDINGS: Left anterior scalene lymph nodes measure up to 10 mm
(previously 8 mm).  Mediastinal lymph nodes have increased in
prominence slightly in the interval as well.  Index high right
paratracheal lymph node measures 13 mm (previously 7 mm).  A second
index mediastinal lymph node in the low left paratracheal station
measures 13 mm (previously 11 mm).  Bihilar adenopathy, as before.
No axillary adenopathy. An 8 mm retrocrural lymph node (image 40)
appears stable.

Atherosclerotic calcification of the arterial vasculature,
including coronary arteries.  Heart size normal.  No pericardial
effusion.

There are numerous bilateral pulmonary nodules seen in a
hematogenous distribution.  Many of these appear minimally more
prominent than on 01/10/2013.  Index right lower lobe nodule
measures 9 x 9 mm (image 34), previously 7 x 8 mm.  No pleural
fluid.  Airway is unremarkable.

Incidental imaging of the upper abdomen shows a stable 7 mm low
attenuation lesion in the hepatic dome.  However, a somewhat ill-
defined low attenuation lesion in the left hepatic lobe measures
1.4 x 1.7 cm (previously 0.8 x 1.0 cm). A gastric mass is again
seen and better evaluated on 01/10/2013.  No worrisome lytic or
sclerotic lesions.
IMPRESSION: 1.  Progressive metastatic disease as evidenced by enlarging left
anterior scalene and mediastinal lymph nodes, enlarging pulmonary
nodules, and an enlarging left hepatic lobe lesion.
2.  Gastric mass, better imaged on 01/10/2013.

## 2014-10-17 NOTE — Telephone Encounter (Signed)
erroneous
